# Patient Record
Sex: Male | Born: 1955 | Race: White | Hispanic: No | Smoking: Former smoker
Health system: Southern US, Community
[De-identification: ages and names within clinical notes are randomized; demographics above are authoritative.]

## PROBLEM LIST (undated history)

## (undated) DIAGNOSIS — F17200 Nicotine dependence, unspecified, uncomplicated: Secondary | ICD-10-CM

## (undated) DIAGNOSIS — I10 Essential (primary) hypertension: Secondary | ICD-10-CM

## (undated) DIAGNOSIS — Z87442 Personal history of urinary calculi: Secondary | ICD-10-CM

## (undated) DIAGNOSIS — E785 Hyperlipidemia, unspecified: Secondary | ICD-10-CM

## (undated) DIAGNOSIS — R911 Solitary pulmonary nodule: Secondary | ICD-10-CM

## (undated) DIAGNOSIS — C3480 Malignant neoplasm of overlapping sites of unspecified bronchus and lung: Secondary | ICD-10-CM

## (undated) DIAGNOSIS — C3490 Malignant neoplasm of unspecified part of unspecified bronchus or lung: Secondary | ICD-10-CM

## (undated) HISTORY — PX: LYMPH NODE BIOPSY: SHX201

## (undated) HISTORY — DX: Malignant neoplasm of unspecified part of unspecified bronchus or lung: C34.90

## (undated) HISTORY — PX: HAND SURGERY: SHX662

## (undated) HISTORY — PX: PORTACATH PLACEMENT: SHX2246

---

## 2015-09-21 ENCOUNTER — Emergency Department (HOSPITAL_BASED_OUTPATIENT_CLINIC_OR_DEPARTMENT_OTHER): Payer: Worker's Compensation

## 2015-09-21 ENCOUNTER — Emergency Department (HOSPITAL_BASED_OUTPATIENT_CLINIC_OR_DEPARTMENT_OTHER)
Admission: EM | Admit: 2015-09-21 | Discharge: 2015-09-21 | Disposition: A | Discharge status: Home / Self Care | Payer: Worker's Compensation | Attending: Emergency Medicine | Admitting: Emergency Medicine

## 2015-09-21 ENCOUNTER — Encounter (HOSPITAL_BASED_OUTPATIENT_CLINIC_OR_DEPARTMENT_OTHER): Payer: Self-pay

## 2015-09-21 DIAGNOSIS — S6992XA Unspecified injury of left wrist, hand and finger(s), initial encounter: Secondary | ICD-10-CM | POA: Diagnosis present

## 2015-09-21 DIAGNOSIS — Y998 Other external cause status: Secondary | ICD-10-CM | POA: Insufficient documentation

## 2015-09-21 DIAGNOSIS — Y9389 Activity, other specified: Secondary | ICD-10-CM | POA: Insufficient documentation

## 2015-09-21 DIAGNOSIS — Y9289 Other specified places as the place of occurrence of the external cause: Secondary | ICD-10-CM | POA: Diagnosis not present

## 2015-09-21 DIAGNOSIS — Y288XXA Contact with other sharp object, undetermined intent, initial encounter: Secondary | ICD-10-CM | POA: Diagnosis not present

## 2015-09-21 DIAGNOSIS — Z720 Tobacco use: Secondary | ICD-10-CM | POA: Insufficient documentation

## 2015-09-21 DIAGNOSIS — S66222A Laceration of extensor muscle, fascia and tendon of left thumb at wrist and hand level, initial encounter: Secondary | ICD-10-CM | POA: Diagnosis not present

## 2015-09-21 MED ORDER — LIDOCAINE HCL 2 % IJ SOLN
10.0000 mL | Freq: Once | INTRAMUSCULAR | Status: AC
  Administered 2015-09-21: 200 mg
  Filled 2015-09-21: qty 20

## 2015-09-21 MED ORDER — HYDROCODONE-ACETAMINOPHEN 5-325 MG PO TABS: ORAL_TABLET | ORAL | Status: DC

## 2015-09-21 MED ORDER — HYDROCODONE-ACETAMINOPHEN 5-325 MG PO TABS
2.0000 | ORAL_TABLET | Freq: Once | ORAL | Status: AC
  Administered 2015-09-21: 2 via ORAL
  Filled 2015-09-21: qty 2

## 2015-09-21 MED ORDER — ONDANSETRON HCL 8 MG PO TABS
4.0000 mg | ORAL_TABLET | Freq: Once | ORAL | Status: AC
  Administered 2015-09-21: 4 mg via ORAL

## 2015-09-21 MED ORDER — ONDANSETRON 4 MG PO TBDP
ORAL_TABLET | ORAL | Status: AC
  Filled 2015-09-21: qty 1

## 2015-09-21 MED ORDER — TETANUS-DIPHTH-ACELL PERTUSSIS 5-2.5-18.5 LF-MCG/0.5 IM SUSP
0.5000 mL | Freq: Once | INTRAMUSCULAR | Status: AC
  Administered 2015-09-21: 0.5 mL via INTRAMUSCULAR
  Filled 2015-09-21: qty 0.5

## 2015-09-21 NOTE — ED Provider Notes (Signed)
CSN: [id number]     Arrival date & time 09/21/15  1100 History   First MD Initiated Contact with Patient 09/21/15 1115     Chief Complaint  Patient presents with  . Hand Injury     (Consider location/radiation/quality/duration/timing/severity/associated sxs/prior Treatment) HPI Comments: Patient is a 59 year old male who presents to the emergency department with a complaint of laceration to the wrist.  The patient states that he was cutting limbs on. He accidentally cut his left wrist with a machete. The patient states he has severe pain and difficulty lifting his thumb. He denies being on any anticoagulation medications. He denies any history of any bleeding disorders. There've been no previous operations or procedures involving the left upper extremity. There were no other lacerations reported. The bleeding has been partially controlled with pressure. He has not taken any medication for this injury.  The history is provided by the patient.    History reviewed. No pertinent past medical history. History reviewed. No pertinent past surgical history. No family history on file. Social History  Substance Use Topics  . Smoking status: Current Every Day Smoker  . Smokeless tobacco: None  . Alcohol Use: Yes     Comment: daily    Review of Systems  Constitutional: Negative for activity change.       All ROS Neg except as noted in HPI  HENT: Negative for nosebleeds.   Eyes: Negative for photophobia and discharge.  Respiratory: Negative for cough, shortness of breath and wheezing.   Cardiovascular: Negative for chest pain and palpitations.  Gastrointestinal: Negative for abdominal pain and blood in stool.  Genitourinary: Negative for dysuria, frequency and hematuria.  Musculoskeletal: Negative for back pain, arthralgias and neck pain.  Skin:       Laceration wrist  Neurological: Negative for dizziness, seizures and speech difficulty.  Psychiatric/Behavioral: Negative for  hallucinations and confusion.      Allergies  Review of patient's allergies indicates no known allergies.  Home Medications   Prior to Admission medications   Medication Sig Start Date End Date Taking? Authorizing Provider  aspirin 81 MG tablet Take 81 mg by mouth daily.   Yes Historical Provider, MD   BP 168/97 mmHg  Pulse 78  Temp(Src) 98.2 F (36.8 C) (Oral)  Resp 20  Ht 5\' 8"  (1.727 m)  Wt 160 lb (72.576 kg)  BMI 24.33 kg/m2  SpO2 98% Physical Exam  Constitutional: He is oriented to person, place, and time. He appears well-developed and well-nourished.  Non-toxic appearance.  HENT:  Head: Normocephalic.  Right Ear: Tympanic membrane and external ear normal.  Left Ear: Tympanic membrane and external ear normal.  Eyes: EOM and lids are normal. Pupils are equal, round, and reactive to light.  Neck: Normal range of motion. Neck supple. Carotid bruit is not present.  Cardiovascular: Normal rate, regular rhythm, normal heart sounds, intact distal pulses and normal pulses.   Pulmonary/Chest: Breath sounds normal. No respiratory distress.  Abdominal: Soft. Bowel sounds are normal. There is no tenderness. There is no guarding.  Musculoskeletal: Normal range of motion.       Left hand: He exhibits tenderness and laceration. Normal sensation noted. Decreased strength noted. He exhibits thumb/finger opposition.       Hands: 6.4 cm laceration of the left wrist at the carpal area behind the left thumb. Limited flexion. Extreme difficulty with extension of the left thumb. Cap refill less than 2 sec. Radial pulse 2+. FROM of all other fingers and wrist. No palpable deformity  of the forearm area. FROM of the left elbow and shoulder.  Lymphadenopathy:       Head (right side): No submandibular adenopathy present.       Head (left side): No submandibular adenopathy present.    He has no cervical adenopathy.  Neurological: He is alert and oriented to person, place, and time. He has normal  strength. No cranial nerve deficit or sensory deficit. Coordination normal.  Good sensory to touch and 2 point discrimination of the left thumb.  Skin: Skin is warm and dry.  Psychiatric: He has a normal mood and affect. His speech is normal.  Nursing note and vitals reviewed.   ED Course  tetanus status updated. Case discussed with Dr. Mina Marble.   Marland Kitchen.Laceration Repair Date/Time: 09/21/2015 1:38 PM Performed by: Ivery Quale Authorized by: Ivery Quale Consent: Verbal consent obtained. Risks and benefits: risks, benefits and alternatives were discussed Consent given by: patient Patient understanding: patient states understanding of the procedure being performed Patient identity confirmed: arm band Time out: Immediately prior to procedure a "time out" was called to verify the correct patient, procedure, equipment, support staff and site/side marked as required. Body area: upper extremity Location details: left wrist Laceration length: 6.4 cm Foreign bodies: no foreign bodies Tendon involvement: extensor tendon involvement. Vascular damage: no Local anesthetic: lidocaine 2% without epinephrine Anesthetic total: 3 ml Patient sedated: no Preparation: Patient was prepped and draped in the usual sterile fashion. Irrigation solution: saline Amount of cleaning: extensive Debridement: none Skin closure: 4-0 nylon Technique: simple Approximation: close Approximation difficulty: simple Dressing: splint Patient tolerance: Patient tolerated the procedure well with no immediate complications   (including critical care time) Labs Review Labs Reviewed - No data to display  Imaging Review X-ray of the left wrist reviewed. No bone involvement, no foreign body noted. No results found. I have personally reviewed and evaluated these images and lab results as part of my medical decision-making.   EKG Interpretation None      MDM Vital signs reviewed. The blood pressure is elevated  at 168/97. Otherwise the vital signs are within normal limits. Pulse oximetry is 98% on room air. Within normal limits by my interpretation. After irrigation, examination of the fall reveals extensor tendon injury. This was confirmed with the examination. The wound was repaired with interrupted sutures of 4-0 nylon. The case has been discussed with Dr. Mina Marble (hand specialist). The option of having repair of his injury today was given to the patient. The patient opted to have the procedure on Monday. The patient will call the office on Monday at 8:30. I have given him instructions to remain nothing by mouth after midnight on Sunday. Prescription for Norco given to the patient for discomfort. The patient has been fitted with a thumb spica splint and bandage. Questions answered. Feel that it is safe for the patient to be discharged home with precautions discussed.    Final diagnoses:  None    **I have reviewed nursing notes, vital signs, and all appropriate lab and imaging results for this patient.Ivery Quale, PA-C 09/21/15 1344  Vanetta Mulders, MD 09/21/15 1500

## 2015-09-21 NOTE — ED Notes (Signed)
Pt cut left hand on machete at work this am-sent from urgent care with bulky dsg/coban

## 2015-09-21 NOTE — Discharge Instructions (Signed)
Please do not eat or drink after midnight on Sunday. Please call the hand surgeon's office at 8:30 Monday morning. At that time they will schedule your surgery, probably for Monday. Please use your thumb splint until seen by the hand specialist. Please keep your hand elevated above your heart. May use Norco for pain. This medication may cause drowsiness, please do not drive, drink, operate machinery, or pertussis. And activities requiring concentration when taking this medication. Please keep your wound clean and dry. Tendon Injury Tendons are strong, cordlike structures that connect muscle to bone. Tendons are made up of woven fibers, like a rope. A tendon injury is a tear (rupture) of the tendon. The rupture may be partial (only a few of the fibers in your tendon rupture) or complete (your entire tendon ruptures). CAUSES  Tendon injuries can be caused by high-stress activities, such as sports. They also can be caused by a repetitive injury or by a single injury from an excessive, rapid force. SYMPTOMS  Symptoms of tendon injury include pain when you move the joint close to the tendon. Other symptoms are swelling, redness, and warmth. DIAGNOSIS  Tendon injuries often can be diagnosed by physical exam. However, sometimes an X-ray exam or advanced imaging, such as magnetic resonance imaging (MRI), is necessary to determine the extent of the injury. TREATMENT  Partial tendon ruptures often can be treated with immobilization. A splint, bandage, or removable brace usually is used to immobilize the injured tendon. Most injured tendons need to be immobilized for 1-2 months before they are completely healed. Complete tendon ruptures may require surgical reattachment.   This information is not intended to replace advice given to you by your health care provider. Make sure you discuss any questions you have with your health care provider.   Document Released: 01/01/2005 Document Revised: 11/13/2011 Document  Reviewed: 02/15/2012 Elsevier Interactive Patient Education [employer]2016 [employer]

## 2016-10-27 IMAGING — CR DG WRIST COMPLETE 3+V*L*
4 series · 4 of 4 positions shown · non-contrast
Comparison: None.

CLINICAL DATA: Laceration left wrist with.  Initial encounter.

EXAM:
LEFT WRIST - COMPLETE 3+ VIEW

[x wrist pa left]
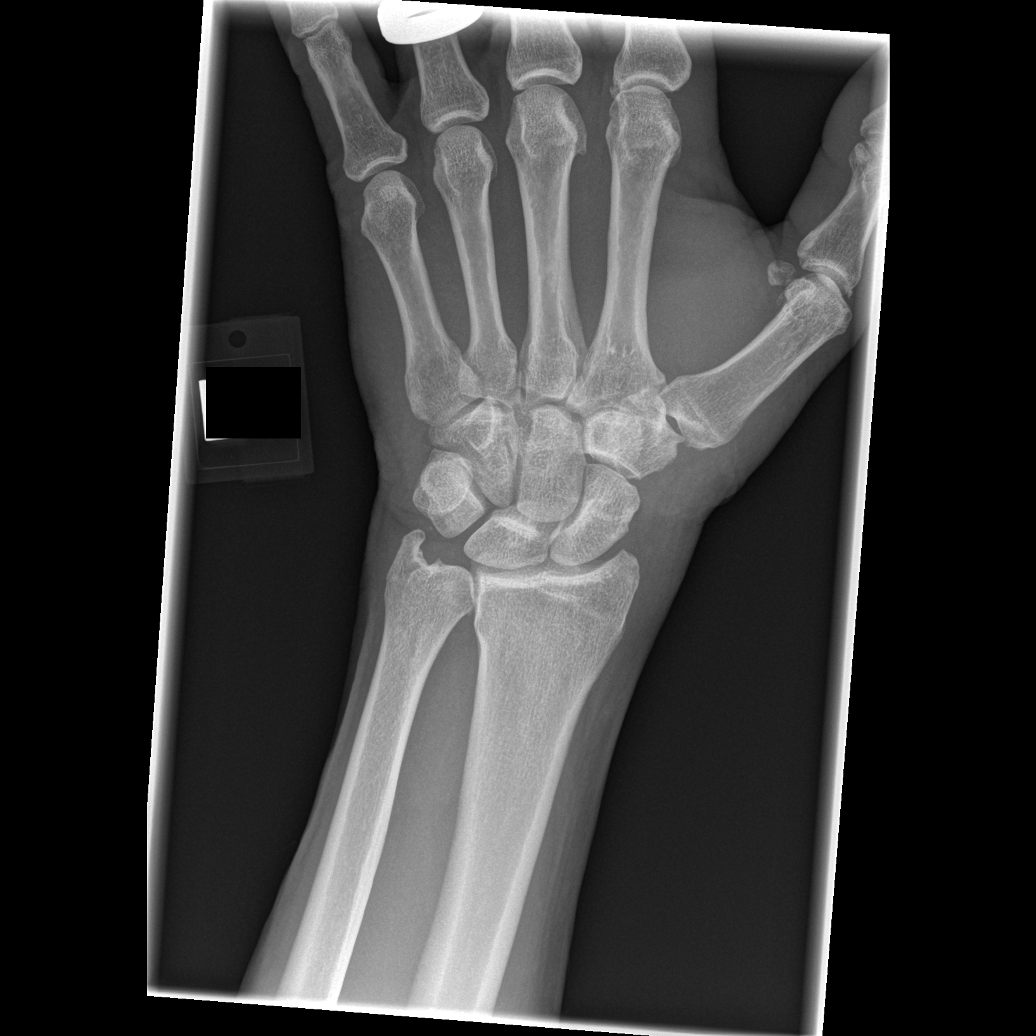

[x wrist obl left]
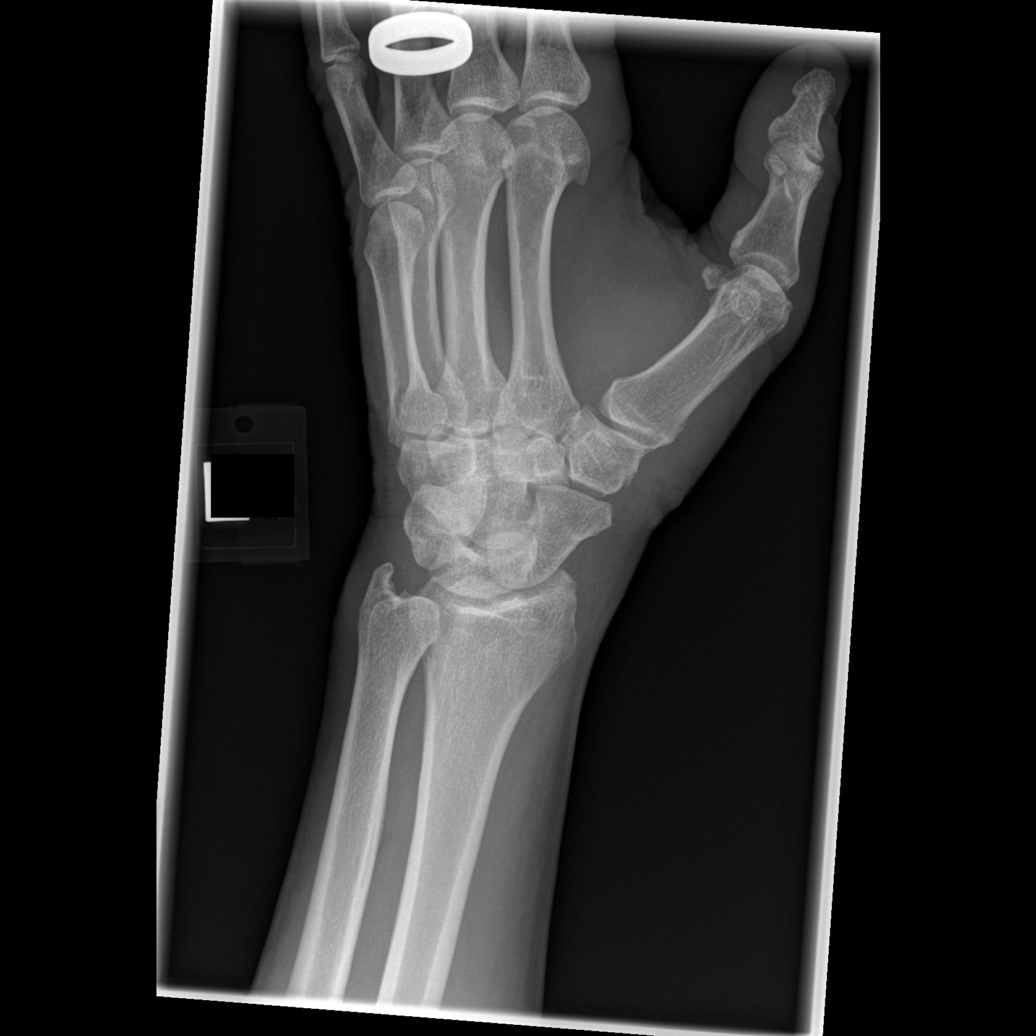

[x wrist lat left]
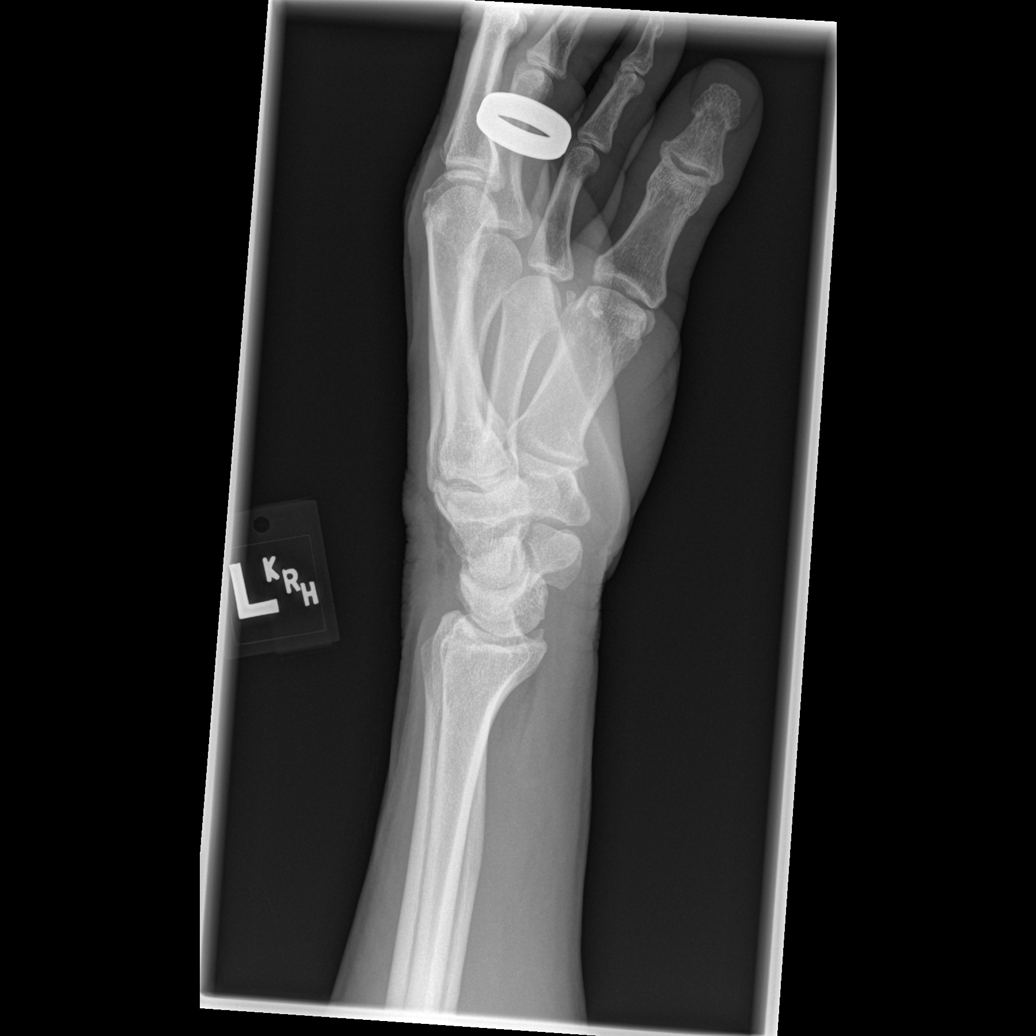

[x navicular]
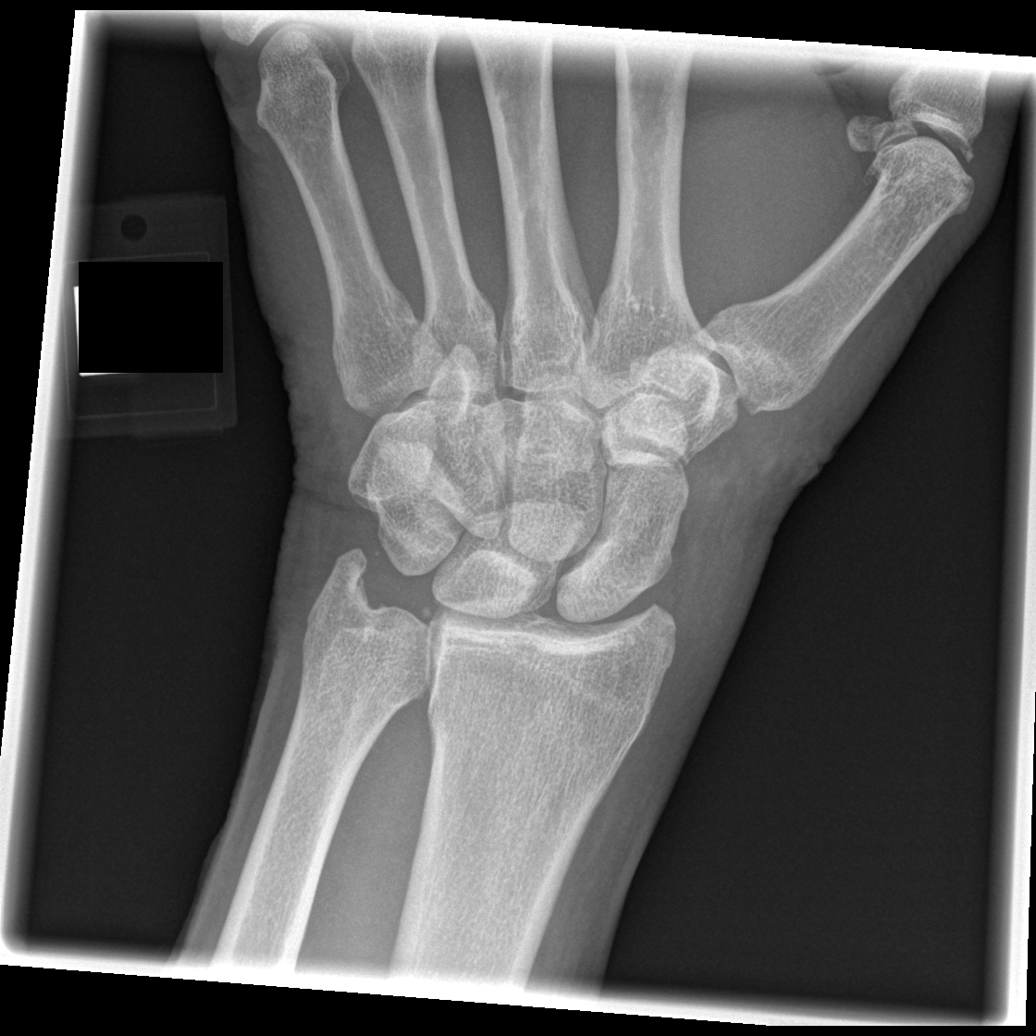

[4 of 4 positions shown; findings below may reference images not displayed]

FINDINGS: No evidence of acute fracture or soft tissue foreign body. Some soft
tissue swelling and injury is noted along the radial aspect of the
wrist and near the base of the thumb. Mild degenerative changes are
present in the wrist itself.
IMPRESSION: No fracture or foreign body.

## 2018-03-09 DIAGNOSIS — I10 Essential (primary) hypertension: Secondary | ICD-10-CM | POA: Diagnosis not present

## 2018-03-09 DIAGNOSIS — R0600 Dyspnea, unspecified: Secondary | ICD-10-CM | POA: Diagnosis not present

## 2018-03-09 DIAGNOSIS — Z720 Tobacco use: Secondary | ICD-10-CM | POA: Diagnosis not present

## 2018-04-08 DIAGNOSIS — Z720 Tobacco use: Secondary | ICD-10-CM | POA: Diagnosis not present

## 2018-04-08 DIAGNOSIS — I10 Essential (primary) hypertension: Secondary | ICD-10-CM | POA: Diagnosis not present

## 2018-04-08 DIAGNOSIS — Z6822 Body mass index (BMI) 22.0-22.9, adult: Secondary | ICD-10-CM | POA: Diagnosis not present

## 2018-07-12 DIAGNOSIS — I10 Essential (primary) hypertension: Secondary | ICD-10-CM | POA: Diagnosis not present

## 2018-07-12 DIAGNOSIS — E785 Hyperlipidemia, unspecified: Secondary | ICD-10-CM | POA: Diagnosis not present

## 2018-10-07 DIAGNOSIS — I10 Essential (primary) hypertension: Secondary | ICD-10-CM | POA: Diagnosis not present

## 2018-10-07 DIAGNOSIS — Z6823 Body mass index (BMI) 23.0-23.9, adult: Secondary | ICD-10-CM | POA: Diagnosis not present

## 2018-10-07 DIAGNOSIS — Z720 Tobacco use: Secondary | ICD-10-CM | POA: Diagnosis not present

## 2018-11-08 DIAGNOSIS — Z720 Tobacco use: Secondary | ICD-10-CM | POA: Diagnosis not present

## 2018-11-08 DIAGNOSIS — I10 Essential (primary) hypertension: Secondary | ICD-10-CM | POA: Diagnosis not present

## 2019-02-28 DIAGNOSIS — E785 Hyperlipidemia, unspecified: Secondary | ICD-10-CM | POA: Diagnosis not present

## 2019-02-28 DIAGNOSIS — Z720 Tobacco use: Secondary | ICD-10-CM | POA: Diagnosis not present

## 2019-02-28 DIAGNOSIS — I10 Essential (primary) hypertension: Secondary | ICD-10-CM | POA: Diagnosis not present

## 2023-11-12 DIAGNOSIS — E785 Hyperlipidemia, unspecified: Secondary | ICD-10-CM | POA: Diagnosis not present

## 2023-11-12 DIAGNOSIS — Z125 Encounter for screening for malignant neoplasm of prostate: Secondary | ICD-10-CM | POA: Diagnosis not present

## 2023-11-12 DIAGNOSIS — Z5320 Procedure and treatment not carried out because of patient's decision for unspecified reasons: Secondary | ICD-10-CM | POA: Diagnosis not present

## 2023-11-12 DIAGNOSIS — F1010 Alcohol abuse, uncomplicated: Secondary | ICD-10-CM | POA: Diagnosis not present

## 2023-11-12 DIAGNOSIS — Z720 Tobacco use: Secondary | ICD-10-CM | POA: Diagnosis not present

## 2023-11-12 DIAGNOSIS — Z6823 Body mass index (BMI) 23.0-23.9, adult: Secondary | ICD-10-CM | POA: Diagnosis not present

## 2023-11-12 DIAGNOSIS — I10 Essential (primary) hypertension: Secondary | ICD-10-CM | POA: Diagnosis not present

## 2023-11-12 DIAGNOSIS — Z79899 Other long term (current) drug therapy: Secondary | ICD-10-CM | POA: Diagnosis not present

## 2024-08-18 DIAGNOSIS — F17210 Nicotine dependence, cigarettes, uncomplicated: Secondary | ICD-10-CM | POA: Diagnosis not present

## 2024-08-18 DIAGNOSIS — J45909 Unspecified asthma, uncomplicated: Secondary | ICD-10-CM | POA: Diagnosis not present

## 2024-08-18 DIAGNOSIS — R0609 Other forms of dyspnea: Secondary | ICD-10-CM | POA: Diagnosis not present

## 2024-08-18 DIAGNOSIS — Z79899 Other long term (current) drug therapy: Secondary | ICD-10-CM | POA: Diagnosis not present

## 2024-08-18 DIAGNOSIS — J309 Allergic rhinitis, unspecified: Secondary | ICD-10-CM | POA: Diagnosis not present

## 2024-08-18 DIAGNOSIS — Z0000 Encounter for general adult medical examination without abnormal findings: Secondary | ICD-10-CM | POA: Diagnosis not present

## 2024-08-18 DIAGNOSIS — Z125 Encounter for screening for malignant neoplasm of prostate: Secondary | ICD-10-CM | POA: Diagnosis not present

## 2024-08-18 DIAGNOSIS — Z131 Encounter for screening for diabetes mellitus: Secondary | ICD-10-CM | POA: Diagnosis not present

## 2024-08-18 DIAGNOSIS — E785 Hyperlipidemia, unspecified: Secondary | ICD-10-CM | POA: Diagnosis not present

## 2024-08-18 DIAGNOSIS — I10 Essential (primary) hypertension: Secondary | ICD-10-CM | POA: Diagnosis not present

## 2024-08-24 DIAGNOSIS — Z122 Encounter for screening for malignant neoplasm of respiratory organs: Secondary | ICD-10-CM | POA: Diagnosis not present

## 2024-08-24 DIAGNOSIS — Z87891 Personal history of nicotine dependence: Secondary | ICD-10-CM | POA: Diagnosis not present

## 2024-08-25 DIAGNOSIS — Z0489 Encounter for examination and observation for other specified reasons: Secondary | ICD-10-CM | POA: Diagnosis not present

## 2024-08-25 DIAGNOSIS — Z87891 Personal history of nicotine dependence: Secondary | ICD-10-CM | POA: Diagnosis not present

## 2024-08-25 DIAGNOSIS — R911 Solitary pulmonary nodule: Secondary | ICD-10-CM | POA: Diagnosis not present

## 2024-08-29 ENCOUNTER — Ambulatory Visit: Admitting: Acute Care

## 2024-08-29 ENCOUNTER — Encounter: Payer: Self-pay | Admitting: Acute Care

## 2024-08-29 VITALS — BP 134/78 | HR 76 | Temp 97.8°F | Ht 68.0 in | Wt 165.4 lb

## 2024-08-29 DIAGNOSIS — R911 Solitary pulmonary nodule: Secondary | ICD-10-CM

## 2024-08-29 DIAGNOSIS — F17200 Nicotine dependence, unspecified, uncomplicated: Secondary | ICD-10-CM

## 2024-08-29 DIAGNOSIS — F17210 Nicotine dependence, cigarettes, uncomplicated: Secondary | ICD-10-CM | POA: Diagnosis not present

## 2024-08-29 DIAGNOSIS — R9389 Abnormal findings on diagnostic imaging of other specified body structures: Secondary | ICD-10-CM | POA: Diagnosis not present

## 2024-08-29 NOTE — Progress Notes (Signed)
 History of Present Illness Daniel Long is a 68 y.o. male every day smoker with a 53 pack year smoking history, referred by his PCP for an abnormal lung cancer screening scan. He will be followed by Dr. Shelah.   08/29/2024 Discussed the use of AI scribe software for clinical note transcription with the patient, who gave verbal consent to proceed.  History of Present Illness Daniel Long is a 68 year old male who presents for lung cancer screening follow-up. This is his first scan, and it was read as a LR 4 B.  He has been undergoing lung cancer screening and recently had his first scan this year, which was read as a Lung-RADS 4B. The scan revealed a right upper lobe pulmonary nodule measuring 16 by 14 millimeters. He has no previous chest CTs for comparison.  He has a significant smoking history, having started smoking at the age of 66 and continuing for approximately 53 years, currently smoking about a pack per day.  No recent weight loss or hemoptysis. He coughs up a little phlegm in the mornings but denies wheezing. He experiences shortness of breath with exertion, such as when going up hills, but not when bending over or standing back up.He has never had pulmonary function testing, so no formal diagnosis of COPD.  There is no family history of lung cancer or any other cancer that he can recall. He has no significant work exposures and does not have a basement in his home, which could expose him to radon.     Test Results: Low Dose Ct chest 09/20/2024 IMPRESSION: Lungs: There is an irregular solid noncalcified nodule in the inferior posterior right upper lobe measures 15 by 14 mm. Mild emphysematous changes Pleura: Negative for pleural effusion or pneumothorax. Airways: Imaged bronchi and trachea otherwisenegative. Mediastinum: Negative for mediastinal mass. Irregular concerning nodule in the right upper lobe.   Lung-RADS Category: 4B VERY SUSPICIOUS. RECOMMEND DIAGNOSTIC CHEST CT;  PET/CT MAY BE CONSIDERED IF >=8MM SOLID NODULE OR SOLID COMPONENT; TISSUE SAMPLING; AND/OR REFERRAL FOR CLINICAL EVALUATION. IF AIRWAY NODULE THEN REFER FOR CLINICAL EVALUATION       No data to display              No data to display          BNP No results found for: BNP  ProBNP No results found for: PROBNP  PFT No results found for: FEV1PRE, FEV1POST, FVCPRE, FVCPOST, TLC, DLCOUNC, PREFEV1FVCRT, PSTFEV1FVCRT  No results found.   Past medical hx History reviewed. No pertinent past medical history.   Social History   Tobacco Use   Smoking status: Every Day    Types: Cigarettes   Tobacco comments:    1 pack a day         Began smoking at 68 years old  Substance Use Topics   Alcohol use: Yes    Comment: daily   Drug use: No    [name] reports that he has been smoking cigarettes. He does not have any smokeless tobacco history on file. He reports current alcohol use. He reports that he does not use drugs.  Tobacco Cessation: Ready to quit: Not Answered Counseling given: Not Answered Tobacco comments: 1 pack a day   Began smoking at 68 years old Current every day smoker , I spent 4 minutes counseling patient on  steps to stop use of tobacco products. I have provided patient with information on receiving free nicotine replacement therapy, and contact numbers for hypnosis for  smoking cessation as well as acupuncture for smoking cessation.   Past surgical hx, Family hx, Social hx all reviewed.  Current Outpatient Medications on File Prior to Visit  Medication Sig   amLODipine-valsartan (EXFORGE) 10-320 MG tablet Take 1 tablet by mouth daily.   aspirin 81 MG tablet Take 81 mg by mouth daily.   atorvastatin (LIPITOR) 20 MG tablet Take 20 mg by mouth daily.   fluticasone (FLONASE) 50 MCG/ACT nasal spray Place 2 sprays into both nostrils daily.   levocetirizine (XYZAL) 5 MG tablet Take 5 mg by mouth daily as needed.   No current  facility-administered medications on file prior to visit.     No Known Allergies  Review Of Systems:  Constitutional:   No  weight loss, night sweats,  Fevers, chills, fatigue, or  lassitude.  HEENT:   No headaches,  Difficulty swallowing,  Tooth/dental problems, or  Sore throat,                No sneezing, itching, ear ache, nasal congestion, post nasal drip,   CV:  No chest pain,  Orthopnea, PND, swelling in lower extremities, anasarca, dizziness, palpitations, syncope.   GI  No heartburn, indigestion, abdominal pain, nausea, vomiting, diarrhea, change in bowel habits, loss of appetite, bloody stools.   Resp: + shortness of breath with exertion or at rest.  + excess mucus only in the morning, no productive cough,  No non-productive cough,  No coughing up of blood.  No change in color of mucus.  No wheezing.  No chest wall deformity  Skin: no rash or lesions.  GU: no dysuria, change in color of urine, no urgency or frequency.  No flank pain, no hematuria   MS:  No joint pain or swelling.  No decreased range of motion.  No back pain.  Psych:  No change in mood or affect. No depression or anxiety.  No memory loss.   Vital Signs BP 134/78   Pulse 76   Temp 97.8 F (36.6 C) (Oral)   Ht 5' 8 (1.727 m)   Wt 165 lb 6.4 oz (75 kg)   SpO2 96%   BMI 25.15 kg/m    Physical Exam:  General- No distress,  A&Ox3, pleasant ENT: No sinus tenderness, TM clear, pale nasal mucosa, no oral exudate,no post nasal drip, no LAN Cardiac: S1, S2, regular rate and rhythm, no murmur Chest: No wheeze/ rales/ dullness; no accessory muscle use, no nasal flaring, no sternal retractions Abd.: Soft Non-tender, ND, BS +, Body mass index is 25.15 kg/m.  Ext: No clubbing cyanosis, edema, no obvious deformities Neuro:  normal strength, MAE x 4, A&O x 3 appropriate Skin: No rashes, warm and dry, no obvious skin lesions  Psych: normal mood and behavior   Assessment & Plan Right upper lobe pulmonary  nodule in current every day smoker  16x14 mm right upper lobe nodule, Lung-RADS 4B, irregular, concerning for malignancy.  No prior CTs for comparison.  Differential includes malignancy vs benign. - Order PET scan at [facility], [place]. - Schedule follow-up 1-2 weeks post-PET scan to discuss results and determine next best steps in plan of care - Instruct to report new symptoms like hemoptysis immediately.  Tobacco use disorder Chronic tobacco use, 1 pack/day for 53 years. Has nicotine patches. Discussed cessation strategies and emphasized quitting to reduce lung cancer risk. - Encourage continued nicotine patch use, consider gum or mints. - Advise reducing cigarette use to one every three hours. - Recommend sugar-free  cinnamon candies for oral fixation. - Provide smoking cessation hotline information.    I spent 20 minutes dedicated to the care of this patient on the date of this encounter to include pre-visit review of records, face-to-face time with the patient discussing conditions above, post visit ordering of testing, clinical documentation with the electronic health record, making appropriate referrals as documented, and communicating necessary information to the patient's healthcare team.    [name] JULIANNA Lites, NP 08/29/2024  10:56 AM

## 2024-08-29 NOTE — Patient Instructions (Signed)
 It is good to see today.   We have we have reviewed your low-dose CT scan which shows a 1.4 x 1.6 mm right upper lobe nodule. With your smoking history this is concerning. I have ordered a PET scan as follow-up diagnostic. You will get a call to get this scheduled. This will be done at [facility] here in [place]. He will follow-up with me 1 to 2 weeks after the scan has been completed so that we can review the results. We will determine next best steps at that time. Please work on quitting smoking. You can receive free nicotine replacement therapy (patches, gum, or mints) by calling 1-800-QUIT NOW. Please call so we can get you on the path to becoming a non-smoker. I know it is hard, but you can do this!  Hypnosis for smoking cessation  [employer] [phone]  Acupuncture for smoking cessation  [facility] [phone]    Call for any blood in your sputum so that we can get you in to be seen sooner. Please contact office for sooner follow up if symptoms do not improve or worsen or seek emergency care

## 2024-09-09 ENCOUNTER — Encounter (HOSPITAL_COMMUNITY)
Admission: RE | Admit: 2024-09-09 | Discharge: 2024-09-09 | Disposition: A | Discharge status: Home / Self Care | Source: Ambulatory Visit | Attending: Acute Care | Admitting: Acute Care

## 2024-09-09 DIAGNOSIS — R911 Solitary pulmonary nodule: Secondary | ICD-10-CM | POA: Insufficient documentation

## 2024-09-09 LAB — GLUCOSE, CAPILLARY: Glucose-Capillary: 95 mg/dL (ref 70–99)

## 2024-09-09 MED ORDER — FLUDEOXYGLUCOSE F - 18 (FDG) INJECTION
8.2500 | Freq: Once | INTRAVENOUS | Status: AC
  Administered 2024-09-09: 8.21 via INTRAVENOUS

## 2024-09-20 ENCOUNTER — Ambulatory Visit: Admitting: Acute Care

## 2024-09-20 ENCOUNTER — Inpatient Hospital Stay (INDEPENDENT_AMBULATORY_CARE_PROVIDER_SITE_OTHER)
Admission: RE | Admit: 2024-09-20 | Discharge: 2024-09-20 | Disposition: A | Discharge status: Home / Self Care | Source: Ambulatory Visit | Attending: Nurse Practitioner | Admitting: Nurse Practitioner

## 2024-09-20 ENCOUNTER — Other Ambulatory Visit (HOSPITAL_BASED_OUTPATIENT_CLINIC_OR_DEPARTMENT_OTHER): Admitting: Radiology

## 2024-09-20 ENCOUNTER — Encounter: Payer: Self-pay | Admitting: Acute Care

## 2024-09-20 ENCOUNTER — Other Ambulatory Visit: Payer: Self-pay | Admitting: Acute Care

## 2024-09-20 ENCOUNTER — Telehealth: Payer: Self-pay | Admitting: Acute Care

## 2024-09-20 VITALS — BP 128/74 | HR 75 | Temp 97.8°F | Ht 68.0 in | Wt 171.8 lb

## 2024-09-20 DIAGNOSIS — R942 Abnormal results of pulmonary function studies: Secondary | ICD-10-CM | POA: Diagnosis not present

## 2024-09-20 DIAGNOSIS — R9389 Abnormal findings on diagnostic imaging of other specified body structures: Secondary | ICD-10-CM

## 2024-09-20 DIAGNOSIS — J449 Chronic obstructive pulmonary disease, unspecified: Secondary | ICD-10-CM | POA: Diagnosis not present

## 2024-09-20 DIAGNOSIS — F17210 Nicotine dependence, cigarettes, uncomplicated: Secondary | ICD-10-CM

## 2024-09-20 DIAGNOSIS — R918 Other nonspecific abnormal finding of lung field: Secondary | ICD-10-CM | POA: Diagnosis not present

## 2024-09-20 DIAGNOSIS — F17200 Nicotine dependence, unspecified, uncomplicated: Secondary | ICD-10-CM

## 2024-09-20 DIAGNOSIS — J439 Emphysema, unspecified: Secondary | ICD-10-CM | POA: Diagnosis not present

## 2024-09-20 DIAGNOSIS — R911 Solitary pulmonary nodule: Secondary | ICD-10-CM

## 2024-09-20 NOTE — H&P (View-Only) (Signed)
 History of Present Illness Severus Brodzinski is a 68 y.o. male every day smoker with a 53 pack year smoking history, referred by his PCP for an abnormal lung cancer screening scan. He will be followed by Dr. Shelah.    09/20/2024 Discussed the use of AI scribe software for clinical note transcription with the patient, who gave verbal consent to proceed.  Synopsis [name] is a 68 year old male who presents for lung cancer screening follow-up. This is his first scan, and it was read as a LR 4 B.   He has been undergoing lung cancer screening and recently had his first scan this year, which was read as a Lung-RADS 4B. The scan revealed a right upper lobe pulmonary nodule measuring 16 by 14 millimeters. He has no previous chest CTs for comparison.   He has a significant smoking history, having started smoking at the age of 67 and continuing for approximately 53 years, currently smoking about a pack per day.   No recent weight loss or hemoptysis. He coughs up a little phlegm in the mornings but denies wheezing. He experiences shortness of breath with exertion, such as when going up hills, but not when bending over or standing back up.He has never had pulmonary function testing, so no formal diagnosis of COPD.   There is no family history of lung cancer or any other cancer that he can recall. He has no significant work exposures and does not have a basement in his home, which could expose him to radon.  Plan was for a PET scan to further evaluate the abnormal lung cancer screening scan. He is here today to review the PET scan results.   History of Present Illness [name] is a 68 year old male who presents with a right upper lobe lung nodule suspicious for primary bronchogenic carcinoma.  A low-dose CT scan on September 20, 2024, revealed a 15 by 14 mm nodule in the right upper lobe. A subsequent PET scan approximately three weeks later showed an 11 mm hypermetabolic nodule in the same location with  an SUV of 8.7. Additionally, a hypermetabolic right parahilar lymph node was noted with an SUV of 8.7 also. Per radiology , this is suspicious for primary bronchogenic carcinoma with nodal involvement, and we discussed next best steps. Plan is for navigational bronchoscopy with biopsies and EBUS on 10/30. I have ordered a Super D CT Chest to be done the morning of the procedure to help plan the navigation. Pt. Received a letter today with all the details of the procedure, where and when to be where.He had no further questions as the OV was concluded.   He denies experiencing weight loss or hemoptysis. He is currently on blood pressure medication at a dose of 80 mg and takes aspirin. His medical history includes high blood pressure and diabetes, but he has no known heart issues or latex allergy. He also denies having sleep apnea.     Test Results:  PET Scan 09/09/2024 Hypermetabolic 11 mm right upper lobe pulmonary nodule (SUV max 8.7), suspicious for primary bronchogenic carcinoma 2. Hypermetabolic right perihilar lymph node (SUV max 8.7), concerning for nodal involvement. 3. No mediastinal nodal metastasis identified. 4. No additional pulmonary nodules identified. 5. No evidence of malignancy in the abdomen or pelvis. 6. No skeletal metastasis.    Low Dose Ct chest 09/20/2024>> [facility] IMPRESSION: Lungs: There is an irregular solid noncalcified nodule in the inferior posterior right upper lobe measures 15 by 14  mm. Mild emphysematous changes Pleura: Negative for pleural effusion or pneumothorax. Airways: Imaged bronchi and trachea otherwisenegative. Mediastinum: Negative for mediastinal mass. Irregular concerning nodule in the right upper lobe.   Lung-RADS Category: 4B VERY SUSPICIOUS. RECOMMEND DIAGNOSTIC CHEST CT; PET/CT MAY BE CONSIDERED IF >=8MM SOLID NODULE OR SOLID COMPONENT; TISSUE SAMPLING; AND/OR REFERRAL FOR CLINICAL EVALUATION. IF AIRWAY NODULE THEN REFER FOR CLINICAL  EVALUATION        No data to display              No data to display          BNP No results found for: BNP  ProBNP No results found for: PROBNP  PFT No results found for: FEV1PRE, FEV1POST, FVCPRE, FVCPOST, TLC, DLCOUNC, PREFEV1FVCRT, PSTFEV1FVCRT  NM PET Image Initial (PI) Skull Base To Thigh Result Date: 09/12/2024 EXAM: PET AND CT SKULL BASE TO MID THIGH 09/09/2024 12:31:55 PM TECHNIQUE: RADIOPHARMACEUTICAL: 8.21 mCi F-18 FDG Uptake time 60 minutes. Glucose level 95 mg/dl. PET imaging was acquired from the base of the skull to the mid thighs. Non-contrast enhanced computed tomography was obtained for attenuation correction and anatomic localization. COMPARISON: CT 08/25/2024 CLINICAL HISTORY: Lung nodule, > 8mm. 8.21 mCi F18 FDG IV RAC @ 1110 MM; BG - 95; pt and dose verified w/ FMU; PET Scan for lung nodule seen on imaging study; lung nodule, > 8mm; Not diabetic; no surgeries; no biopsies; no therapies or treatments; no OBMD; EOV FINDINGS: HEAD AND NECK: No metabolically active cervical lymphadenopathy. CHEST: Rounded nodule in the right upper lobe measures 11 mm and has metabolic activity with SUVmax equal 8.7. There is a hypermetabolic right perihilar lymph node on image 70 with SUVmax 8.7. No additional pulmonary nodules. No mediastinal nodal metastasis identified. No central. ABDOMEN AND PELVIS: No evidence of malignancy in the abdomen and pelvis. Physiologic activity within the gastrointestinal and genitourinary systems. No additional CT findings of atherosclerotic change of the aorta. BONES AND SOFT TISSUE: No skeletal metastasis. IMPRESSION: 1. Hypermetabolic 11 mm right upper lobe pulmonary nodule (SUV max 8.7), suspicious for primary bronchogenic carcinoma 2. Hypermetabolic right perihilar lymph node (SUV max 8.7), concerning for nodal involvement. 3. No mediastinal nodal metastasis identified. 4. No additional pulmonary nodules identified. 5. No evidence  of malignancy in the abdomen or pelvis. 6. No skeletal metastasis. Electronically signed by: Norleen Boxer MD 09/12/2024 12:07 PM EDT RP Workstation: [id number]     Past medical hx History reviewed. No pertinent past medical history.   Social History   Tobacco Use   Smoking status: Every Day    Types: Cigarettes   Tobacco comments:    14-15 a day KRD 09/20/2024        Began smoking at 68 years old  Substance Use Topics   Alcohol use: Yes    Comment: daily   Drug use: No    Mr.Griffee reports that he has been smoking cigarettes. He does not have any smokeless tobacco history on file. He reports current alcohol use. He reports that he does not use drugs.  Tobacco Cessation: Ready to quit: Not Answered Counseling given: Not Answered Tobacco comments: 14-15 a day KRD 09/20/2024  Began smoking at 68 years old Current every day smoker, counseled not to smoke.  Past surgical hx, Family hx, Social hx all reviewed.  Current Outpatient Medications on File Prior to Visit  Medication Sig   amLODipine-valsartan (EXFORGE) 10-320 MG tablet Take 1 tablet by mouth daily.   aspirin 81 MG tablet Take 81 mg  by mouth daily.   atorvastatin (LIPITOR) 20 MG tablet Take 20 mg by mouth daily.   fluticasone (FLONASE) 50 MCG/ACT nasal spray Place 2 sprays into both nostrils daily.   levocetirizine (XYZAL) 5 MG tablet Take 5 mg by mouth daily as needed.   No current facility-administered medications on file prior to visit.     No Known Allergies  Review Of Systems:  Constitutional:   No  weight loss, night sweats,  Fevers, chills, fatigue, or  lassitude.  HEENT:   No headaches,  Difficulty swallowing,  Tooth/dental problems, or  Sore throat,                No sneezing, itching, ear ache, nasal congestion, post nasal drip,   CV:  No chest pain,  Orthopnea, PND, swelling in lower extremities, anasarca, dizziness, palpitations, syncope.   GI  No heartburn, indigestion, abdominal pain, nausea,  vomiting, diarrhea, change in bowel habits, loss of appetite, bloody stools.   Resp: + baseline  shortness of breath with exertion less at rest.  No excess mucus, no productive cough,  No non-productive cough,  No coughing up of blood.  No change in color of mucus.  No wheezing.  No chest wall deformity  Skin: no rash or lesions.  GU: no dysuria, change in color of urine, no urgency or frequency.  No flank pain, no hematuria   MS:  No joint pain or swelling.  No decreased range of motion.  No back pain.  Psych:  No change in mood or affect. No depression or anxiety.  No memory loss.   Vital Signs BP 128/74   Pulse 75   Temp 97.8 F (36.6 C) (Temporal)   Ht 5' 8 (1.727 m)   Wt 171 lb 12.8 oz (77.9 kg)   SpO2 95%   BMI 26.12 kg/m    Physical Exam:  General- No distress,  A&Ox3, pleasant ENT: No sinus tenderness, TM clear, pale nasal mucosa, no oral exudate,no post nasal drip, no LAN Cardiac: S1, S2, regular rate and rhythm, no murmur Chest: No wheeze/ rales/ dullness; no accessory muscle use, no nasal flaring, no sternal retractions, slightly diminished per bases Abd.: Soft Non-tender, ND, BS +, Body mass index is 26.12 kg/m.  Ext: No clubbing cyanosis, edema, no obvious deformities noted Neuro:  normal strength, MAE x 4, A&O x 3, appropriate Skin: No rashes, warm and dry, no obvious skin lesions  Psych: normal mood and behavior  Assessment & Plan Suspicious right upper lobe lung nodule and right parahilar lymphadenopathy PET scan indicates hypermetabolic right upper lobe nodule and right parahilar lymph node, suggestive of primary bronchogenic carcinoma with possible nodal involvement. - Schedule robotic-assisted navigational bronchoscopy with Dr. Norleen Chill on October 30th for diagnosis confirmation and disease assessment. - Hold aspirin on October 28th and 29th before procedure. - Will need  transportation and post-anesthesia care for 24 hours. - Follow up one week  post-procedure to review biopsy results.  AVS 09/20/2024 Your PET scan does show the nodule of concern is PET avid. We discussed the option of biopsy now , and you have agreed to proceed.  I have placed an order for a bronchoscopy with biopsies.  We have discussed the procedure in detail.  We have reviewed the risks and benefits of the procedure. These include bleeding, infection, puncture of the lung, and adverse reaction to anesthesia. You have agreed to proceed with biopsy to evaluate the right  upper lobe nodule. Your procedure will be done  by Dr. Norleen Chill You will receive a letter today with date time and information pertaining to the procedure. You will need someone to drive you to the procedure, stay with you during the procedure, and stay with you after the procedure. You will also need someone to stay with you for 24 hours after anesthesia to ensure you have cleared and are doing well. You will follow-up with me 1 week after the procedure to review the results and to ensure you are doing well. Call if you need us  prior to the procedure or if you have any questions at all. Please contact office for sooner follow up if symptoms do not improve or worsen or seek emergency care       I spent 25 minutes dedicated to the care of this patient on the date of this encounter to include pre-visit review of records, face-to-face time with the patient discussing conditions above, post visit ordering of testing, clinical documentation with the electronic health record, making appropriate referrals as documented, and communicating necessary information to the patient's healthcare team.      [name] JULIANNA Lites, NP 09/20/2024  11:21 AM

## 2024-09-20 NOTE — Telephone Encounter (Signed)
 Please schedule the following:  Provider performing procedure: [place]ald Diagnosis:  Right sided lung nodule Which side if for nodule / mass?  Right  Procedure:  Robotic assisted navigational bronchoscopy with biopsy  Has patient been spoken to by Provider and given informed consent?  Yes, [name] NP on 09/20/2024 Anesthesia:  Lajuana Do you need Fluro?  Yes Duration of procedure:  1.5 hours Date:  10/06/2024 Alternate Date: There is availability 10/30  Time: Fourth case of the day Location:  [facility] Does patient have OSA?  No DM? No Or Latex allergy?  No Medication Restriction/ Anticoagulate/Antiplatelet:  Hold ASA 2 days before procedure (Hold 10/28, 10/29 and day of procedure) Pre-op Labs Ordered:determined by Anesthesia Imaging request:   LDCT was done 08/29/2024 (If, SuperDimension CT Chest, please have STAT courier sent to [facility])

## 2024-09-20 NOTE — Progress Notes (Signed)
 History of Present Illness Daniel Long is a 68 y.o. male every day smoker with a 53 pack year smoking history, referred by his PCP for an abnormal lung cancer screening scan. He will be followed by Dr. Shelah.    09/20/2024 Discussed the use of AI scribe software for clinical note transcription with the patient, who gave verbal consent to proceed.  Synopsis Daniel Long is a 68 year old male who presents for lung cancer screening follow-up. This is his first scan, and it was read as a LR 4 B.   He has been undergoing lung cancer screening and recently had his first scan this year, which was read as a Lung-RADS 4B. The scan revealed a right upper lobe pulmonary nodule measuring 16 by 14 millimeters. He has no previous chest CTs for comparison.   He has a significant smoking history, having started smoking at the age of 67 and continuing for approximately 53 years, currently smoking about a pack per day.   No recent weight loss or hemoptysis. He coughs up a little phlegm in the mornings but denies wheezing. He experiences shortness of breath with exertion, such as when going up hills, but not when bending over or standing back up.He has never had pulmonary function testing, so no formal diagnosis of COPD.   There is no family history of lung cancer or any other cancer that he can recall. He has no significant work exposures and does not have a basement in his home, which could expose him to radon.  Plan was for a PET scan to further evaluate the abnormal lung cancer screening scan. He is here today to review the PET scan results.   History of Present Illness Daniel Long is a 68 year old male who presents with a right upper lobe lung nodule suspicious for primary bronchogenic carcinoma.  A low-dose CT scan on September 20, 2024, revealed a 15 by 14 mm nodule in the right upper lobe. A subsequent PET scan approximately three weeks later showed an 11 mm hypermetabolic nodule in the same location with  an SUV of 8.7. Additionally, a hypermetabolic right parahilar lymph node was noted with an SUV of 8.7 also. Per radiology , this is suspicious for primary bronchogenic carcinoma with nodal involvement, and we discussed next best steps. Plan is for navigational bronchoscopy with biopsies and EBUS on 10/30. I have ordered a Super D CT Chest to be done the morning of the procedure to help plan the navigation. Pt. Received a letter today with all the details of the procedure, where and when to be where.He had no further questions as the OV was concluded.   He denies experiencing weight loss or hemoptysis. He is currently on blood pressure medication at a dose of 80 mg and takes aspirin. His medical history includes high blood pressure and diabetes, but he has no known heart issues or latex allergy. He also denies having sleep apnea.     Test Results:  PET Scan 09/09/2024 Hypermetabolic 11 mm right upper lobe pulmonary nodule (SUV max 8.7), suspicious for primary bronchogenic carcinoma 2. Hypermetabolic right perihilar lymph node (SUV max 8.7), concerning for nodal involvement. 3. No mediastinal nodal metastasis identified. 4. No additional pulmonary nodules identified. 5. No evidence of malignancy in the abdomen or pelvis. 6. No skeletal metastasis.    Low Dose Ct chest 09/20/2024>> [facility] IMPRESSION: Lungs: There is an irregular solid noncalcified nodule in the inferior posterior right upper lobe measures 15 by 14  mm. Mild emphysematous changes Pleura: Negative for pleural effusion or pneumothorax. Airways: Imaged bronchi and trachea otherwisenegative. Mediastinum: Negative for mediastinal mass. Irregular concerning nodule in the right upper lobe.   Lung-RADS Category: 4B VERY SUSPICIOUS. RECOMMEND DIAGNOSTIC CHEST CT; PET/CT MAY BE CONSIDERED IF >=8MM SOLID NODULE OR SOLID COMPONENT; TISSUE SAMPLING; AND/OR REFERRAL FOR CLINICAL EVALUATION. IF AIRWAY NODULE THEN REFER FOR CLINICAL  EVALUATION        No data to display              No data to display          BNP No results found for: BNP  ProBNP No results found for: PROBNP  PFT No results found for: FEV1PRE, FEV1POST, FVCPRE, FVCPOST, TLC, DLCOUNC, PREFEV1FVCRT, PSTFEV1FVCRT  NM PET Image Initial (PI) Skull Base To Thigh Result Date: 09/12/2024 EXAM: PET AND CT SKULL BASE TO MID THIGH 09/09/2024 12:31:55 PM TECHNIQUE: RADIOPHARMACEUTICAL: 8.21 mCi F-18 FDG Uptake time 60 minutes. Glucose level 95 mg/dl. PET imaging was acquired from the base of the skull to the mid thighs. Non-contrast enhanced computed tomography was obtained for attenuation correction and anatomic localization. COMPARISON: CT 08/25/2024 CLINICAL HISTORY: Lung nodule, > 8mm. 8.21 mCi F18 FDG IV RAC @ 1110 MM; BG - 95; pt and dose verified w/ FMU; PET Scan for lung nodule seen on imaging study; lung nodule, > 8mm; Not diabetic; no surgeries; no biopsies; no therapies or treatments; no OBMD; EOV FINDINGS: HEAD AND NECK: No metabolically active cervical lymphadenopathy. CHEST: Rounded nodule in the right upper lobe measures 11 mm and has metabolic activity with SUVmax equal 8.7. There is a hypermetabolic right perihilar lymph node on image 70 with SUVmax 8.7. No additional pulmonary nodules. No mediastinal nodal metastasis identified. No central. ABDOMEN AND PELVIS: No evidence of malignancy in the abdomen and pelvis. Physiologic activity within the gastrointestinal and genitourinary systems. No additional CT findings of atherosclerotic change of the aorta. BONES AND SOFT TISSUE: No skeletal metastasis. IMPRESSION: 1. Hypermetabolic 11 mm right upper lobe pulmonary nodule (SUV max 8.7), suspicious for primary bronchogenic carcinoma 2. Hypermetabolic right perihilar lymph node (SUV max 8.7), concerning for nodal involvement. 3. No mediastinal nodal metastasis identified. 4. No additional pulmonary nodules identified. 5. No evidence  of malignancy in the abdomen or pelvis. 6. No skeletal metastasis. Electronically signed by: Norleen Boxer MD 09/12/2024 12:07 PM EDT RP Workstation: [id number]     Past medical hx History reviewed. No pertinent past medical history.   Social History   Tobacco Use   Smoking status: Every Day    Types: Cigarettes   Tobacco comments:    14-15 a day KRD 09/20/2024        Began smoking at 68 years old  Substance Use Topics   Alcohol use: Yes    Comment: daily   Drug use: No    Mr.Griffee reports that he has been smoking cigarettes. He does not have any smokeless tobacco history on file. He reports current alcohol use. He reports that he does not use drugs.  Tobacco Cessation: Ready to quit: Not Answered Counseling given: Not Answered Tobacco comments: 14-15 a day KRD 09/20/2024  Began smoking at 68 years old Current every day smoker, counseled not to smoke.  Past surgical hx, Family hx, Social hx all reviewed.  Current Outpatient Medications on File Prior to Visit  Medication Sig   amLODipine-valsartan (EXFORGE) 10-320 MG tablet Take 1 tablet by mouth daily.   aspirin 81 MG tablet Take 81 mg  by mouth daily.   atorvastatin (LIPITOR) 20 MG tablet Take 20 mg by mouth daily.   fluticasone (FLONASE) 50 MCG/ACT nasal spray Place 2 sprays into both nostrils daily.   levocetirizine (XYZAL) 5 MG tablet Take 5 mg by mouth daily as needed.   No current facility-administered medications on file prior to visit.     No Known Allergies  Review Of Systems:  Constitutional:   No  weight loss, night sweats,  Fevers, chills, fatigue, or  lassitude.  HEENT:   No headaches,  Difficulty swallowing,  Tooth/dental problems, or  Sore throat,                No sneezing, itching, ear ache, nasal congestion, post nasal drip,   CV:  No chest pain,  Orthopnea, PND, swelling in lower extremities, anasarca, dizziness, palpitations, syncope.   GI  No heartburn, indigestion, abdominal pain, nausea,  vomiting, diarrhea, change in bowel habits, loss of appetite, bloody stools.   Resp: + baseline  shortness of breath with exertion less at rest.  No excess mucus, no productive cough,  No non-productive cough,  No coughing up of blood.  No change in color of mucus.  No wheezing.  No chest wall deformity  Skin: no rash or lesions.  GU: no dysuria, change in color of urine, no urgency or frequency.  No flank pain, no hematuria   MS:  No joint pain or swelling.  No decreased range of motion.  No back pain.  Psych:  No change in mood or affect. No depression or anxiety.  No memory loss.   Vital Signs BP 128/74   Pulse 75   Temp 97.8 F (36.6 C) (Temporal)   Ht 5' 8 (1.727 m)   Wt 171 lb 12.8 oz (77.9 kg)   SpO2 95%   BMI 26.12 kg/m    Physical Exam:  General- No distress,  A&Ox3, pleasant ENT: No sinus tenderness, TM clear, pale nasal mucosa, no oral exudate,no post nasal drip, no LAN Cardiac: S1, S2, regular rate and rhythm, no murmur Chest: No wheeze/ rales/ dullness; no accessory muscle use, no nasal flaring, no sternal retractions, slightly diminished per bases Abd.: Soft Non-tender, ND, BS +, Body mass index is 26.12 kg/m.  Ext: No clubbing cyanosis, edema, no obvious deformities noted Neuro:  normal strength, MAE x 4, A&O x 3, appropriate Skin: No rashes, warm and dry, no obvious skin lesions  Psych: normal mood and behavior  Assessment & Plan Suspicious right upper lobe lung nodule and right parahilar lymphadenopathy PET scan indicates hypermetabolic right upper lobe nodule and right parahilar lymph node, suggestive of primary bronchogenic carcinoma with possible nodal involvement. - Schedule robotic-assisted navigational bronchoscopy with Dr. Norleen Chill on October 30th for diagnosis confirmation and disease assessment. - Hold aspirin on October 28th and 29th before procedure. - Will need  transportation and post-anesthesia care for 24 hours. - Follow up one week  post-procedure to review biopsy results.  AVS 09/20/2024 Your PET scan does show the nodule of concern is PET avid. We discussed the option of biopsy now , and you have agreed to proceed.  I have placed an order for a bronchoscopy with biopsies.  We have discussed the procedure in detail.  We have reviewed the risks and benefits of the procedure. These include bleeding, infection, puncture of the lung, and adverse reaction to anesthesia. You have agreed to proceed with biopsy to evaluate the right  upper lobe nodule. Your procedure will be done  by Dr. Norleen Chill You will receive a letter today with date time and information pertaining to the procedure. You will need someone to drive you to the procedure, stay with you during the procedure, and stay with you after the procedure. You will also need someone to stay with you for 24 hours after anesthesia to ensure you have cleared and are doing well. You will follow-up with me 1 week after the procedure to review the results and to ensure you are doing well. Call if you need us  prior to the procedure or if you have any questions at all. Please contact office for sooner follow up if symptoms do not improve or worsen or seek emergency care       I spent 25 minutes dedicated to the care of this patient on the date of this encounter to include pre-visit review of records, face-to-face time with the patient discussing conditions above, post visit ordering of testing, clinical documentation with the electronic health record, making appropriate referrals as documented, and communicating necessary information to the patient's healthcare team.      [name] JULIANNA Lites, NP 09/20/2024  11:21 AM

## 2024-09-20 NOTE — Patient Instructions (Signed)
 It is good to see you today. Your PET scan does show the nodule of concern is PET avid. We discussed the option of biopsy now , and you have agreed to proceed.  I have placed an order for a bronchoscopy with biopsies.  We have discussed the procedure in detail.  We have reviewed the risks and benefits of the procedure. These include bleeding, infection, puncture of the lung, and adverse reaction to anesthesia. You have agreed to proceed with biopsy to evaluate the right  upper lobe nodule. Your procedure will be done by Dr. Norleen Chill You will receive a letter today with date time and information pertaining to the procedure. You will need someone to drive you to the procedure, stay with you during the procedure, and stay with you after the procedure. You will also need someone to stay with you for 24 hours after anesthesia to ensure you have cleared and are doing well. You will follow-up with me 1 week after the procedure to review the results and to ensure you are doing well. Call if you need us  prior to the procedure or if you have any questions at all. Please contact office for sooner follow up if symptoms do not improve or worsen or seek emergency care

## 2024-09-20 NOTE — Telephone Encounter (Signed)
 Scheduled with Kendall for 10/06/24 at 12:08 PM, check in by 9:30 AM.  Case# [id number].  Follow up scheduled with SG on 10/12/24 at 9:30 AM.  Letter prepared & given to pt by nurse while in the office.  Sending to [facility] for PA.

## 2024-10-04 ENCOUNTER — Encounter (HOSPITAL_COMMUNITY): Payer: Self-pay | Admitting: Pulmonary Disease

## 2024-10-05 ENCOUNTER — Other Ambulatory Visit: Payer: Self-pay

## 2024-10-05 ENCOUNTER — Encounter (HOSPITAL_COMMUNITY): Payer: Self-pay | Admitting: Pulmonary Disease

## 2024-10-05 NOTE — Anesthesia Preprocedure Evaluation (Signed)
 Anesthesia Evaluation  Patient identified by MRN, date of birth, ID band Patient awake    Reviewed: Allergy & Precautions, NPO status , Patient's Chart, lab work & pertinent test results  History of Anesthesia Complications Negative for: history of anesthetic complications  Airway Mallampati: II  TM Distance: >3 FB Neck ROM: Full    Dental no notable dental hx. (+) Edentulous Upper, Missing,    Pulmonary Current Smoker and Patient abstained from smoking. R sided lung nodule a 53 pack year smoking history   Pulmonary exam normal breath sounds clear to auscultation       Cardiovascular hypertension, Pt. on medications (-) angina (-) Past MI Normal cardiovascular exam Rhythm:Regular Rate:Normal     Neuro/Psych    GI/Hepatic Neg liver ROS,neg GERD  ,,  Endo/Other  negative endocrine ROSneg diabetes    Renal/GU      Musculoskeletal   Abdominal   Peds  Hematology   Anesthesia Other Findings   Reproductive/Obstetrics                              Anesthesia Physical Anesthesia Plan  ASA: 3  Anesthesia Plan: General   Post-op Pain Management: Tylenol  PO (pre-op)*   Induction: Intravenous  PONV Risk Score and Plan: Treatment may vary due to age or medical condition, Dexamethasone, Ondansetron , Midazolam, Propofol infusion and TIVA  Airway Management Planned: Oral ETT  Additional Equipment: None  Intra-op Plan:   Post-operative Plan: Extubation in OR  Informed Consent: I have reviewed the patients History and Physical, chart, labs and discussed the procedure including the risks, benefits and alternatives for the proposed anesthesia with the patient or authorized representative who has indicated his/her understanding and acceptance.     Dental advisory given  Plan Discussed with: CRNA and Surgeon  Anesthesia Plan Comments:          Anesthesia Quick Evaluation

## 2024-10-05 NOTE — Progress Notes (Signed)
 SDW call  Patient was given pre-op instructions over the phone. Patient verbalized understanding of instructions provided. Denies SOB, fever, cough or chest pain.     PCP - [name] Sic, NP Cardiologist - denies Pulmonary:    PPM/ICD - denies Device Orders - na Rep Notified - na   Chest x-ray - na EKG -  DOS, 10/06/2024 Stress Test - 9/2/20220, [facility] - 08/10/2019, [facility] -   Sleep Study/sleep apnea/CPAP: denies  Non-diabetic  Blood Thinner Instructions: denies Aspirin Instructions:states last dose 10/03/2024   ERAS Protcol - NPO   Anesthesia review: No  Your procedure is scheduled on Thursday October 06, 2024  Report to [facility] Main Entrance A at  0900  A.M., then check in with the Admitting office.  Call this number if you have problems the morning of surgery:  [phone]   If you have any questions prior to your surgery date call [phone]: Open Monday-Friday 8am-4pm If you experience any cold or flu symptoms such as cough, fever, chills, shortness of breath, etc. between now and your scheduled surgery, please notify us  at the above number    Remember:  Do not eat or drink after midnight the night before your surgery  Take these medicines the morning of surgery with A SIP OF WATER:  Atorvastatin, flonase  As needed: xyzal  As of today, STOP taking any Aspirin (unless otherwise instructed by your surgeon) Aleve, Naproxen, Ibuprofen, Motrin, Advil, Goody's, BC's, all herbal medications, fish oil, and all vitamins.

## 2024-10-06 ENCOUNTER — Encounter (HOSPITAL_COMMUNITY): Payer: Self-pay | Admitting: Pulmonary Disease

## 2024-10-06 ENCOUNTER — Encounter (HOSPITAL_COMMUNITY)
Admission: RE | Disposition: A | Discharge status: Home / Self Care | Payer: Self-pay | Source: Home / Self Care | Attending: Pulmonary Disease

## 2024-10-06 ENCOUNTER — Encounter (HOSPITAL_COMMUNITY): Payer: Self-pay | Admitting: Anesthesiology

## 2024-10-06 ENCOUNTER — Ambulatory Visit (HOSPITAL_COMMUNITY)

## 2024-10-06 ENCOUNTER — Ambulatory Visit (HOSPITAL_BASED_OUTPATIENT_CLINIC_OR_DEPARTMENT_OTHER): Payer: Self-pay | Admitting: Anesthesiology

## 2024-10-06 ENCOUNTER — Ambulatory Visit (HOSPITAL_COMMUNITY)
Admission: RE | Admit: 2024-10-06 | Discharge: 2024-10-06 | Disposition: A | Discharge status: Home / Self Care | Attending: Pulmonary Disease | Admitting: Pulmonary Disease

## 2024-10-06 DIAGNOSIS — C3411 Malignant neoplasm of upper lobe, right bronchus or lung: Secondary | ICD-10-CM | POA: Diagnosis not present

## 2024-10-06 DIAGNOSIS — C3432 Malignant neoplasm of lower lobe, left bronchus or lung: Secondary | ICD-10-CM | POA: Insufficient documentation

## 2024-10-06 DIAGNOSIS — R911 Solitary pulmonary nodule: Secondary | ICD-10-CM

## 2024-10-06 DIAGNOSIS — E119 Type 2 diabetes mellitus without complications: Secondary | ICD-10-CM | POA: Insufficient documentation

## 2024-10-06 DIAGNOSIS — F17210 Nicotine dependence, cigarettes, uncomplicated: Secondary | ICD-10-CM | POA: Diagnosis not present

## 2024-10-06 DIAGNOSIS — I10 Essential (primary) hypertension: Secondary | ICD-10-CM | POA: Diagnosis not present

## 2024-10-06 DIAGNOSIS — F17200 Nicotine dependence, unspecified, uncomplicated: Secondary | ICD-10-CM | POA: Insufficient documentation

## 2024-10-06 DIAGNOSIS — Z48813 Encounter for surgical aftercare following surgery on the respiratory system: Secondary | ICD-10-CM | POA: Diagnosis not present

## 2024-10-06 DIAGNOSIS — R846 Abnormal cytological findings in specimens from respiratory organs and thorax: Secondary | ICD-10-CM | POA: Diagnosis not present

## 2024-10-06 HISTORY — PX: VIDEO BRONCHOSCOPY WITH ENDOBRONCHIAL NAVIGATION: SHX6175

## 2024-10-06 HISTORY — PX: VIDEO BRONCHOSCOPY WITH ENDOBRONCHIAL ULTRASOUND: SHX6177

## 2024-10-06 HISTORY — DX: Essential (primary) hypertension: I10

## 2024-10-06 HISTORY — PX: BRONCHIAL NEEDLE ASPIRATION BIOPSY: SHX5106

## 2024-10-06 HISTORY — PX: BRONCHIAL BIOPSY: SHX5109

## 2024-10-06 LAB — CBC
HCT: 47 % (ref 39.0–52.0)
Hemoglobin: 16.3 g/dL (ref 13.0–17.0)
MCH: 31.8 pg (ref 26.0–34.0)
MCHC: 34.7 g/dL (ref 30.0–36.0)
MCV: 91.8 fL (ref 80.0–100.0)
Platelets: 252 K/uL (ref 150–400)
RBC: 5.12 MIL/uL (ref 4.22–5.81)
RDW: 12.7 % (ref 11.5–15.5)
WBC: 9.9 K/uL (ref 4.0–10.5)
nRBC: 0 % (ref 0.0–0.2)

## 2024-10-06 LAB — BASIC METABOLIC PANEL WITH GFR
Anion gap: 9 (ref 5–15)
BUN: 11 mg/dL (ref 8–23)
CO2: 23 mmol/L (ref 22–32)
Calcium: 8.7 mg/dL — ABNORMAL LOW (ref 8.9–10.3)
Chloride: 107 mmol/L (ref 98–111)
Creatinine, Ser: 0.62 mg/dL (ref 0.61–1.24)
GFR, Estimated: >60 mL/min (ref >60)
Glucose, Bld: 110 mg/dL — ABNORMAL HIGH (ref 70–99)
Potassium: 3.9 mmol/L (ref 3.5–5.1)
Sodium: 139 mmol/L (ref 135–145)

## 2024-10-06 SURGERY — VIDEO BRONCHOSCOPY WITH ENDOBRONCHIAL NAVIGATION
Anesthesia: General | Laterality: Right

## 2024-10-06 MED ORDER — ONDANSETRON HCL 4 MG/2ML IJ SOLN
INTRAMUSCULAR | Status: DC | PRN
  Administered 2024-10-06: 4 mg via INTRAVENOUS

## 2024-10-06 MED ORDER — PHENYLEPHRINE HCL-NACL 20-0.9 MG/250ML-% IV SOLN
INTRAVENOUS | Status: DC | PRN
Start: 2024-10-06 — End: 2024-10-06
  Administered 2024-10-06: 20 ug/min via INTRAVENOUS

## 2024-10-06 MED ORDER — FENTANYL CITRATE (PF) 100 MCG/2ML IJ SOLN: 25.0000 ug | INTRAMUSCULAR | Status: DC | PRN

## 2024-10-06 MED ORDER — MIDAZOLAM HCL (PF) 2 MG/2ML IJ SOLN
INTRAMUSCULAR | Status: DC | PRN
Start: 2024-10-06 — End: 2024-10-06
  Administered 2024-10-06: 2 mg via INTRAVENOUS

## 2024-10-06 MED ORDER — SODIUM CHLORIDE 0.9 % IV SOLN: INTRAVENOUS | Status: DC | PRN

## 2024-10-06 MED ORDER — LACTATED RINGERS IV SOLN: INTRAVENOUS | Status: DC

## 2024-10-06 MED ORDER — FENTANYL CITRATE (PF) 250 MCG/5ML IJ SOLN
INTRAMUSCULAR | Status: DC | PRN
  Administered 2024-10-06 (×4): 50 ug via INTRAVENOUS

## 2024-10-06 MED ORDER — DEXAMETHASONE SOD PHOSPHATE PF 10 MG/ML IJ SOLN
INTRAMUSCULAR | Status: DC | PRN
  Administered 2024-10-06: 10 mg via INTRAVENOUS

## 2024-10-06 MED ORDER — ONDANSETRON HCL 4 MG/2ML IJ SOLN: 4.0000 mg | Freq: Once | INTRAMUSCULAR | Status: DC | PRN

## 2024-10-06 MED ORDER — CHLORHEXIDINE GLUCONATE 0.12 % MT SOLN: 15.0000 mL | Freq: Once | OROMUCOSAL | Status: AC

## 2024-10-06 MED ORDER — SUGAMMADEX SODIUM 200 MG/2ML IV SOLN
INTRAVENOUS | Status: DC | PRN
  Administered 2024-10-06: 400 mg via INTRAVENOUS

## 2024-10-06 MED ORDER — EPHEDRINE SULFATE-NACL 50-0.9 MG/10ML-% IV SOSY
PREFILLED_SYRINGE | INTRAVENOUS | Status: DC | PRN
  Administered 2024-10-06 (×2): 5 mg via INTRAVENOUS

## 2024-10-06 MED ORDER — GLYCOPYRROLATE PF 0.2 MG/ML IJ SOSY
PREFILLED_SYRINGE | INTRAMUSCULAR | Status: DC | PRN
  Administered 2024-10-06 (×3): .1 mg via INTRAVENOUS

## 2024-10-06 MED ORDER — LIDOCAINE 2% (20 MG/ML) 5 ML SYRINGE
INTRAMUSCULAR | Status: DC | PRN
  Administered 2024-10-06: 100 mg via INTRAVENOUS

## 2024-10-06 MED ORDER — CHLORHEXIDINE GLUCONATE 0.12 % MT SOLN
OROMUCOSAL | Status: AC
  Administered 2024-10-06: 15 mL via OROMUCOSAL
  Filled 2024-10-06: qty 15

## 2024-10-06 MED ORDER — PROPOFOL 500 MG/50ML IV EMUL
INTRAVENOUS | Status: DC | PRN
  Administered 2024-10-06: 125 ug/kg/min via INTRAVENOUS

## 2024-10-06 MED ORDER — PROPOFOL 10 MG/ML IV BOLUS
INTRAVENOUS | Status: DC | PRN
  Administered 2024-10-06: 20 mg via INTRAVENOUS
  Administered 2024-10-06: 150 mg via INTRAVENOUS
  Administered 2024-10-06: 20 mg via INTRAVENOUS
  Administered 2024-10-06: 50 mg via INTRAVENOUS

## 2024-10-06 MED ORDER — ACETAMINOPHEN 10 MG/ML IV SOLN: 1000.0000 mg | Freq: Once | INTRAVENOUS | Status: DC | PRN

## 2024-10-06 MED ORDER — ROCURONIUM BROMIDE 10 MG/ML (PF) SYRINGE
PREFILLED_SYRINGE | INTRAVENOUS | Status: DC | PRN
  Administered 2024-10-06: 10 mg via INTRAVENOUS
  Administered 2024-10-06: 65 mg via INTRAVENOUS
  Administered 2024-10-06 (×2): 10 mg via INTRAVENOUS
  Administered 2024-10-06: 35 mg via INTRAVENOUS
  Administered 2024-10-06: 20 mg via INTRAVENOUS

## 2024-10-06 NOTE — Transfer of Care (Signed)
 Immediate Anesthesia Transfer of Care Note  Patient: Daniel Long  Procedure(s) Performed: VIDEO BRONCHOSCOPY WITH ENDOBRONCHIAL NAVIGATION (Right) BRONCHOSCOPY, WITH EBUS BRONCHOSCOPY, WITH NEEDLE ASPIRATION BIOPSY BRONCHOSCOPY, WITH BIOPSY  Patient Location: PACU  Anesthesia Type:General  Level of Consciousness: awake, alert , oriented, and drowsy  Airway & Oxygen Therapy: Patient Spontanous Breathing and Patient connected to face mask oxygen  Post-op Assessment: Report given to RN, Post -op Vital signs reviewed and stable, and Patient moving all extremities X 4  Post vital signs: Reviewed and stable  Last Vitals:  Vitals Value Taken Time  BP    Temp    Pulse 84 10/06/24 14:20  Resp 19 10/06/24 14:20  SpO2 92 % 10/06/24 14:20  Vitals shown include unfiled device data.  Last Pain:  Vitals:   10/06/24 0906  TempSrc: Oral  PainSc: 0-No pain         Complications: No notable events documented.

## 2024-10-06 NOTE — Procedures (Signed)
 Procedure Note  Patient: Daniel Long  [employer] Cios mobile C-arm was utilized to identify and biopsy right upper lobe nodule.   Needle-in-lesion was confirmed using real-time Cios imaging, and images were uploaded to PACS.     Dorn Chill, MD [facility] Office: [phone]   See Amion for personal pager PCCM on call pager [phone] until 7pm. Please call Elink 7p-7a. [phone]

## 2024-10-06 NOTE — Anesthesia Procedure Notes (Addendum)
 Procedure Name: Intubation Date/Time: 10/06/2024 11:58 AM  Performed by: Mollie Olivia SAUNDERS, CRNAPre-anesthesia Checklist: Patient identified, Emergency Drugs available, Suction available and Patient being monitored Patient Re-evaluated:Patient Re-evaluated prior to induction Oxygen Delivery Method: Circle system utilized Preoxygenation: Pre-oxygenation with 100% oxygen Induction Type: IV induction Ventilation: Oral airway inserted - appropriate to patient size, Mask ventilation with difficulty and Two handed mask ventilation required Laryngoscope Size: Glidescope and 4 Grade View: Grade I Tube type: Oral Tube size: 8.5 mm Number of attempts: 1 Airway Equipment and Method: Oral airway, Rigid stylet and Video-laryngoscopy Placement Confirmation: ETT inserted through vocal cords under direct vision, positive ETCO2 and breath sounds checked- equal and bilateral Secured at: 23 cm Tube secured with: Tape Dental Injury: Teeth and Oropharynx as per pre-operative assessment

## 2024-10-06 NOTE — Interval H&P Note (Signed)
 History and Physical Interval Note:  10/06/2024 11:24 AM  Daniel Long  has presented today for surgery, with the diagnosis of lung nodule.  The various methods of treatment have been discussed with the patient and family. After consideration of risks, benefits and other options for treatment, the patient has consented to  Procedure(s): VIDEO BRONCHOSCOPY WITH ENDOBRONCHIAL NAVIGATION (Right) BRONCHOSCOPY, WITH EBUS (N/A) as a surgical intervention.  The patient's history has been reviewed, patient examined, no change in status, stable for surgery.  I have reviewed the patient's chart and labs.  Questions were answered to the patient's satisfaction.     Dorn KATHEE Chill

## 2024-10-06 NOTE — Op Note (Signed)
 Video Bronchoscopy with Robotic Assisted Bronchoscopic Navigation   Date of Operation: 10/06/2024   Pre-op Diagnosis: Lung Nodules  Post-op Diagnosis: Lung Nodules  Surgeon: Dorn Chill, MD  Assistants: Carter Helling, MD  Anesthesia: General endotracheal anesthesia  Operation: Flexible video fiberoptic bronchoscopy with robotic assistance and biopsies.  Estimated Blood Loss: Minimal  Complications: None  Indications and History: Daniel Long is a 68 y.o. male with history of lung nodules.  Recommendation made to achieve a tissue diagnosis via robotic assisted navigational bronchoscopy.  The risks, benefits, complications, treatment options and expected outcomes were discussed with the patient.  The possibilities of pneumothorax, pneumonia, reaction to medication, pulmonary aspiration, perforation of a viscus, bleeding, failure to diagnose a condition and creating a complication requiring transfusion or operation were discussed with the patient who freely signed the consent.    Description of Procedure: The patient was seen in the Preoperative Area, was examined and was deemed appropriate to proceed.  The patient was taken to [facility] room 3, identified as Daniel Long and the procedure verified as Flexible Video Fiberoptic Bronchoscopy.  A Time Out was held and the above information confirmed.   Prior to the date of the procedure a high-resolution CT scan of the chest was performed. Utilizing ION software program a virtual tracheobronchial tree was generated to allow the creation of distinct navigation pathways to the patient's parenchymal abnormalities. After being taken to the operating room general anesthesia was initiated and the patient  was orally intubated. The video fiberoptic bronchoscope was introduced via the endotracheal tube and a general inspection was performed which showed normal right and left lung anatomy. Aspiration of the bilateral mainstems was completed to remove  any remaining secretions. Robotic catheter inserted into patient's endotracheal tube.   Target #1 Right Upper Lobe Nodule: The distinct navigation pathways prepared prior to this procedure were then utilized to navigate to patient's lesion identified on CT scan. The robotic catheter was secured into place and the vision probe was withdrawn.  Lesion location was approximated using fluoroscopy.  Local registration and targeting was performed using [employer] Cios mobile C-arm three-dimensional imaging. Under fluoroscopic guidance transbronchial needle biopsies, and transbronchial forceps biopsies were performed to be sent for cytology and pathology.  Needle-in-lesion was confirmed using Cios mobile C-arm.  Target #2 Left Lower Lobe Ground Glass Nodule: The distinct navigation pathways prepared prior to this procedure were then utilized to navigate to patient's lesion identified on CT scan. The robotic catheter was secured into place and the vision probe was withdrawn.  Lesion location was approximated using fluoroscopy.  Local registration and targeting was performed using [employer] Cios mobile C-arm three-dimensional imaging. Under fluoroscopic guidance transbronchial needle brushings, transbronchial needle biopsies, and transbronchial forceps biopsies were performed to be sent for cytology and pathology.    EBUS The EBUS bronchoscope was then introduce and lymph node stations 11R, 7 and 4L biopsies via transbronchial needle biopsies.  At the end of the procedure a general airway inspection was performed and there was no evidence of active bleeding. The bronchoscope was removed.  The patient tolerated the procedure well. There was no significant blood loss and there were no obvious complications. A post-procedural chest x-ray is pending.  Samples Target #1 Right Upper Lobe Nodule: 1. Transbronchial needle biopsies from RUL nodule 2. Transbronchial forceps biopsies from RUL  Nodule   Samples Target #2 Left Lower lobe Ground Glass Nodule: 1. Transbronchial needle brushings from LLL nodule 2. Transbronchial needle biopsies from LLL nodule 3.  Transbronchial forceps biopsies from LLL nodule  Ebus Samples 11R - slides and cytology 7 - slides and cytology 4L - slides and cytology  Plans:  The patient will be discharged from the PACU to home when recovered from anesthesia and after chest x-ray is reviewed. We will review the cytology, pathology and microbiology results with the patient when they become available. Outpatient followup will be with [name], NP.

## 2024-10-07 LAB — CYTOLOGY - NON PAP

## 2024-10-10 LAB — CYTOLOGY - NON PAP

## 2024-10-10 NOTE — Progress Notes (Signed)
 History of Present Illness Daniel Long is a 68 y.o. male with ***   10/10/2024 Discussed the use of AI scribe software for clinical note transcription with the patient, who gave verbal consent to proceed.  History of Present Illness Hi Daniel Long,   Patient has follow up with you on 11/5. RUL and LLL showing adenocarcinoma. The LN stations biopsied were negative. There were some right hilar nodes with uptake on PET scan. 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      Test Results:     Latest Ref Rng & Units 10/06/2024    9:20 AM  CBC  WBC 4.0 - 10.5 K/uL 9.9   Hemoglobin 13.0 - 17.0 g/dL 83.6   Hematocrit 60.9 - 52.0 % 47.0   Platelets 150 - 400 K/uL 252        Latest Ref Rng & Units 10/06/2024    9:20 AM  BMP  Glucose 70 - 99 mg/dL 889   BUN 8 - 23 mg/dL 11   Creatinine 9.38 - 1.24 mg/dL 9.37   Sodium 864 - 854 mmol/L 139   Potassium 3.5 - 5.1 mmol/L 3.9   Chloride 98 - 111 mmol/L 107   CO2 22 - 32 mmol/L 23   Calcium 8.9 - 10.3 mg/dL 8.7     BNP No results found for: BNP  ProBNP No results found for: PROBNP  PFT No results found for: FEV1PRE, FEV1POST, FVCPRE, FVCPOST, TLC, DLCOUNC, PREFEV1FVCRT, PSTFEV1FVCRT  DG Chest Port 1 View Result Date: 10/06/2024 CLINICAL DATA:  Status post bronchoscopy and biopsy. EXAM: PORTABLE CHEST 1 VIEW COMPARISON:  Chest CT dated 09/20/2024. FINDINGS: No pneumothorax post biopsy. Right upper lobe nodule better seen on the chest CT. No consolidative changes. No pleural effusion. The cardiac silhouette is within normal limits. No acute osseous pathology. IMPRESSION: 1. No pneumothorax post biopsy. 2. Right upper lobe nodule. Electronically Signed   By: Daniel Long M.D.   On: 10/06/2024 14:41   DG C-ARM BRONCHOSCOPY Result Date: 10/06/2024 C-ARM BRONCHOSCOPY: Fluoroscopy was utilized by the requesting physician.  No radiographic interpretation.   DG C-Arm  1-60 Min-No Report Result Date: 10/06/2024 Fluoroscopy was utilized by the requesting physician.  No radiographic interpretation.   DG C-Arm 1-60 Min-No Report Result Date: 10/06/2024 Fluoroscopy was utilized by the requesting physician.  No radiographic interpretation.   CT Super D Chest Wo Contrast Result Date: 09/24/2024 CLINICAL DATA:  Abnormal chest x-ray, multiple pulmonary nodules. EXAM: CT CHEST WITHOUT CONTRAST TECHNIQUE: Multidetector CT imaging of the chest was performed using thin slice collimation for electromagnetic bronchoscopy planning purposes, without intravenous contrast. RADIATION DOSE REDUCTION: This exam was performed according to the departmental dose-optimization program which includes automated exposure control, adjustment of the mA and/or kV according to patient size and/or use of iterative reconstruction technique. COMPARISON:  08/25/2024, 09/09/2024. FINDINGS: Cardiovascular: The heart is normal in size and there is no pericardial effusion. Multi-vessel coronary artery calcifications are noted. There is atherosclerotic calcification aorta without evidence of aneurysm. The pulmonary trunk is normal in caliber. Mediastinum/Nodes: No enlarged mediastinal, hilar, or axillary lymph nodes. Thyroid gland, trachea, and esophagus demonstrate no significant findings. Lungs/Pleura: Centrilobular emphysematous changes are present in the lungs. No consolidation, effusion, or pneumothorax is seen. There is atelectasis at the lung bases. There is a stable lobular right upper lobe pulmonary nodule measuring 1.3 cm, axial image 30. A few scattered 3 mm pulmonary nodules are present bilaterally. Upper Abdomen: No  acute abnormality. Musculoskeletal: No acute osseous abnormality. Degenerative changes are present in the thoracic spine. IMPRESSION: 1. Stable lobular pulmonary nodule in the right upper lobe, recently evaluated by PET-CT and suspicious for primary bronchogenic carcinoma. 2. Scattered 3  mm pulmonary nodules bilaterally. 3. Emphysema. 4. Coronary artery calcifications. 5. Aortic atherosclerosis. Electronically Signed   By: Daniel Long M.D.   On: 09/24/2024 19:19     Past medical hx Past Medical History:  Diagnosis Date   Hypertension      Social History   Tobacco Use   Smoking status: Every Day    Types: Cigarettes   Tobacco comments:    14-15 a day KRD 09/20/2024        Began smoking at 68 years old  Vaping Use   Vaping status: Never Used  Substance Use Topics   Alcohol use: Not Currently    Comment: daily   Drug use: Yes    Types: Marijuana    [name] reports that he has been smoking cigarettes. He does not have any smokeless tobacco history on file. He reports that he does not currently use alcohol. He reports current drug use. Drug: Marijuana.  Tobacco Cessation: Ready to quit: Not Answered Counseling given: Not Answered Tobacco comments: 14-15 a day KRD 09/20/2024  Began smoking at 68 years old   Past surgical hx, Family hx, Social hx all reviewed.  Current Outpatient Medications on File Prior to Visit  Medication Sig   amLODipine-valsartan (EXFORGE) 10-320 MG tablet Take 1 tablet by mouth daily.   aspirin 81 MG tablet Take 81 mg by mouth daily.   atorvastatin (LIPITOR) 20 MG tablet Take 20 mg by mouth daily.   fluticasone (FLONASE) 50 MCG/ACT nasal spray Place 1-2 sprays into both nostrils daily as needed for allergies.   levocetirizine (XYZAL) 5 MG tablet Take 5 mg by mouth daily as needed.   Multiple Vitamins-Minerals (ONE A DAY MEN 50 PLUS) TABS Take 1 tablet by mouth daily.   No current facility-administered medications on file prior to visit.     No Known Allergies  Review Of Systems:  Constitutional:   No  weight loss, night sweats,  Fevers, chills, fatigue, or  lassitude.  HEENT:   No headaches,  Difficulty swallowing,  Tooth/dental problems, or  Sore throat,                No sneezing, itching, ear ache, nasal congestion,  post nasal drip,   CV:  No chest pain,  Orthopnea, PND, swelling in lower extremities, anasarca, dizziness, palpitations, syncope.   GI  No heartburn, indigestion, abdominal pain, nausea, vomiting, diarrhea, change in bowel habits, loss of appetite, bloody stools.   Resp: No shortness of breath with exertion or at rest.  No excess mucus, no productive cough,  No non-productive cough,  No coughing up of blood.  No change in color of mucus.  No wheezing.  No chest wall deformity  Skin: no rash or lesions.  GU: no dysuria, change in color of urine, no urgency or frequency.  No flank pain, no hematuria   MS:  No joint pain or swelling.  No decreased range of motion.  No back pain.  Psych:  No change in mood or affect. No depression or anxiety.  No memory loss.   Vital Signs There were no vitals taken for this visit.   Physical Exam:  General- No distress,  A&Ox3 ENT: No sinus tenderness, TM clear, pale nasal mucosa, no oral exudate,no post nasal  drip, no LAN Cardiac: S1, S2, regular rate and rhythm, no murmur Chest: No wheeze/ rales/ dullness; no accessory muscle use, no nasal flaring, no sternal retractions Abd.: Soft Non-tender Ext: No clubbing cyanosis, edema Neuro:  normal strength Skin: No rashes, warm and dry Psych: normal mood and behavior  Physical Exam    Assessment/Plan  Assessment and Plan Assessment & Plan        Daniel Long JULIANNA Lites, NP 10/10/2024  3:57 PM

## 2024-10-12 ENCOUNTER — Ambulatory Visit: Admitting: Acute Care

## 2024-10-12 ENCOUNTER — Encounter: Payer: Self-pay | Admitting: Acute Care

## 2024-10-12 ENCOUNTER — Telehealth: Payer: Self-pay

## 2024-10-12 VITALS — BP 124/79 | HR 73 | Temp 98.3°F | Ht 68.0 in | Wt 172.0 lb

## 2024-10-12 DIAGNOSIS — C3480 Malignant neoplasm of overlapping sites of unspecified bronchus and lung: Secondary | ICD-10-CM

## 2024-10-12 DIAGNOSIS — F17200 Nicotine dependence, unspecified, uncomplicated: Secondary | ICD-10-CM

## 2024-10-12 DIAGNOSIS — F17210 Nicotine dependence, cigarettes, uncomplicated: Secondary | ICD-10-CM | POA: Diagnosis not present

## 2024-10-12 DIAGNOSIS — C3432 Malignant neoplasm of lower lobe, left bronchus or lung: Secondary | ICD-10-CM

## 2024-10-12 DIAGNOSIS — Z9889 Other specified postprocedural states: Secondary | ICD-10-CM

## 2024-10-12 DIAGNOSIS — C3411 Malignant neoplasm of upper lobe, right bronchus or lung: Secondary | ICD-10-CM

## 2024-10-12 DIAGNOSIS — R9389 Abnormal findings on diagnostic imaging of other specified body structures: Secondary | ICD-10-CM

## 2024-10-12 DIAGNOSIS — C3490 Malignant neoplasm of unspecified part of unspecified bronchus or lung: Secondary | ICD-10-CM

## 2024-10-12 DIAGNOSIS — R942 Abnormal results of pulmonary function studies: Secondary | ICD-10-CM

## 2024-10-12 DIAGNOSIS — R911 Solitary pulmonary nodule: Secondary | ICD-10-CM

## 2024-10-12 NOTE — Patient Instructions (Addendum)
 It is god to see you today. I am glad you did well after the biopsies. We have reviewed your biopsy results. They were positive for adenocarcinoma, this is a non small cell lung cancer,  of the right upper lobe and left lower lobe.  The lymph node biopsies were negative for malignancy. I have ordered an MRI brain to complete staging. I have also ordered Pulmonary Function testing to see if you are a surgical candidate. You  are scheduled 11/ 11/2024 at 2 pm. At [facility]. I have referred you to medical oncology as well as radiation oncology for treatment options. If your PFT's support surgical intervention, we may be able to consider surgery as an option. Call if you need us  for anything. Good luck with treatment. Please contact office for sooner follow up if symptoms do not improve or worsen or seek emergency care

## 2024-10-13 NOTE — Anesthesia Postprocedure Evaluation (Signed)
 Anesthesia Post Note  Patient: Koren Officer  Procedure(s) Performed: VIDEO BRONCHOSCOPY WITH ENDOBRONCHIAL NAVIGATION (Right) BRONCHOSCOPY, WITH EBUS BRONCHOSCOPY, WITH NEEDLE ASPIRATION BIOPSY BRONCHOSCOPY, WITH BIOPSY     Patient location during evaluation: PACU Anesthesia Type: General Level of consciousness: awake and alert Pain management: pain level controlled Vital Signs Assessment: post-procedure vital signs reviewed and stable Respiratory status: spontaneous breathing, nonlabored ventilation and respiratory function stable Cardiovascular status: blood pressure returned to baseline and stable Postop Assessment: no apparent nausea or vomiting Anesthetic complications: no   No notable events documented.                 Leona Alen

## 2024-10-19 ENCOUNTER — Ambulatory Visit (HOSPITAL_BASED_OUTPATIENT_CLINIC_OR_DEPARTMENT_OTHER)

## 2024-10-19 DIAGNOSIS — C3480 Malignant neoplasm of overlapping sites of unspecified bronchus and lung: Secondary | ICD-10-CM

## 2024-10-19 LAB — PULMONARY FUNCTION TEST
DL/VA % pred: 121 %
DL/VA: 5.01 ml/min/mmHg/L
DLCO cor % pred: 106 %
DLCO cor: 26.34 ml/min/mmHg
DLCO unc % pred: 111 %
DLCO unc: 27.52 ml/min/mmHg
FEF 25-75 Post: 2.32 L/s
FEF 25-75 Pre: 2.39 L/s
FEF2575-%Change-Post: -3 %
FEF2575-%Pred-Post: 97 %
FEF2575-%Pred-Pre: 100 %
FEV1-%Change-Post: -1 %
FEV1-%Pred-Post: 79 %
FEV1-%Pred-Pre: 80 %
FEV1-Post: 2.45 L
FEV1-Pre: 2.49 L
FEV1FVC-%Change-Post: -1 %
FEV1FVC-%Pred-Pre: 106 %
FEV6-%Change-Post: 0 %
FEV6-%Pred-Post: 79 %
FEV6-%Pred-Pre: 79 %
FEV6-Post: 3.14 L
FEV6-Pre: 3.14 L
FEV6FVC-%Change-Post: 0 %
FEV6FVC-%Pred-Post: 105 %
FEV6FVC-%Pred-Pre: 105 %
FVC-%Change-Post: 0 %
FVC-%Pred-Post: 75 %
FVC-%Pred-Pre: 75 %
FVC-Post: 3.15 L
FVC-Pre: 3.16 L
Post FEV1/FVC ratio: 78 %
Post FEV6/FVC ratio: 100 %
Pre FEV1/FVC ratio: 79 %
Pre FEV6/FVC Ratio: 100 %
RV % pred: 133 %
RV: 3.07 L
TLC % pred: 96 %
TLC: 6.37 L

## 2024-10-19 NOTE — Progress Notes (Signed)
 Full PFT performed today.

## 2024-10-19 NOTE — Patient Instructions (Signed)
 Full PFT performed today.

## 2024-10-20 ENCOUNTER — Ambulatory Visit (INDEPENDENT_AMBULATORY_CARE_PROVIDER_SITE_OTHER)
Admission: RE | Admit: 2024-10-20 | Discharge: 2024-10-20 | Disposition: A | Discharge status: Home / Self Care | Source: Ambulatory Visit | Attending: Acute Care | Admitting: Acute Care

## 2024-10-20 ENCOUNTER — Other Ambulatory Visit: Payer: Self-pay

## 2024-10-20 DIAGNOSIS — C3490 Malignant neoplasm of unspecified part of unspecified bronchus or lung: Secondary | ICD-10-CM

## 2024-10-20 DIAGNOSIS — Z8673 Personal history of transient ischemic attack (TIA), and cerebral infarction without residual deficits: Secondary | ICD-10-CM | POA: Diagnosis not present

## 2024-10-20 DIAGNOSIS — Q048 Other specified congenital malformations of brain: Secondary | ICD-10-CM | POA: Diagnosis not present

## 2024-10-20 MED ORDER — GADOBUTROL 1 MMOL/ML IV SOLN
8.0000 mL | Freq: Once | INTRAVENOUS | Status: AC | PRN
  Administered 2024-10-20: 8 mL via INTRAVENOUS

## 2024-10-20 NOTE — Progress Notes (Signed)
 The proposed treatment discussed in conference is for discussion purpose only and is not a binding recommendation.  The patients have not been physically examined, or presented with their treatment options.  Therefore, final treatment plans cannot be decided.

## 2024-10-26 NOTE — Progress Notes (Signed)
 [facility] at [facility] [address] [place],  [place]  [address] [phone]  Clinic Day:  10/27/2024  Referring physician: Benjamine [name] DASEN, NP   HISTORY OF PRESENT ILLNESS:  The patient is a 68 y.o. male who I was asked to consult upon for newly diagnosed lung cancer.   Due to his prominent smoking history, the patient has been undergoing screening chest CTs.  His most recent study revealed a right upper lobe lung mass, which was proven per a follow-up PET scan to be hypermetabolically active, with an SUV of 8.7.  Furthermore, a right perihilar lymph node was hypermetabolic, with an SUV of 8.7.  This gentleman's PET scan did not show any other abnormal findings.  However, when the patient underwent a bronchoscopy, mapping of his left lower lobe showed a very vague ground glass lesion that was biopsy-proven to be adenocarcinoma.  This gentleman's right upper lobe lung lesion was also biopsied, which came back consistent with adenocarcinoma.  Interestingly enough, this gentleman's very hypermetabolic perihilar lymph node came back negative for disease.  The patient comes in today to go over his biopsy results and their implications.  The patient denies ever having baseline shortness of breath, hemoptysis, or weight loss over this calendar year which ever alerted him to lung cancer being present.  The patient has smoked at least a pack of cigarettes daily for 53 years.  PAST MEDICAL HISTORY:   Past Medical History:  Diagnosis Date   Hyperlipidemia    Hypertension    Lung cancer (HCC)     PAST SURGICAL HISTORY:   Past Surgical History:  Procedure Laterality Date   BRONCHIAL BIOPSY  10/06/2024   Procedure: BRONCHOSCOPY, WITH BIOPSY;  Surgeon: Kara Dorn NOVAK, MD;  Location: [facility] ENDOSCOPY;  Service: Pulmonary;;   BRONCHIAL NEEDLE ASPIRATION BIOPSY  10/06/2024   Procedure: BRONCHOSCOPY, WITH NEEDLE ASPIRATION BIOPSY;  Surgeon: Kara Dorn NOVAK, MD;  Location: [facility]  ENDOSCOPY;  Service: Pulmonary;;   HAND SURGERY Left    LYMPH NODE BIOPSY     VIDEO BRONCHOSCOPY WITH ENDOBRONCHIAL NAVIGATION Right 10/06/2024   Procedure: VIDEO BRONCHOSCOPY WITH ENDOBRONCHIAL NAVIGATION;  Surgeon: Kara Dorn NOVAK, MD;  Location: [facility] ENDOSCOPY;  Service: Pulmonary;  Laterality: Right;   VIDEO BRONCHOSCOPY WITH ENDOBRONCHIAL ULTRASOUND N/A 10/06/2024   Procedure: BRONCHOSCOPY, WITH EBUS;  Surgeon: Kara Dorn NOVAK, MD;  Location: [facility] ENDOSCOPY;  Service: Pulmonary;  Laterality: N/A;    CURRENT MEDICATIONS:   Current Outpatient Medications  Medication Sig Dispense Refill   amLODipine-valsartan (EXFORGE) 10-320 MG tablet Take 1 tablet by mouth daily.     aspirin 81 MG tablet Take 81 mg by mouth daily.     atorvastatin (LIPITOR) 20 MG tablet Take 20 mg by mouth daily.     fluticasone (FLONASE) 50 MCG/ACT nasal spray Place 1-2 sprays into both nostrils daily as needed for allergies.     levocetirizine (XYZAL) 5 MG tablet Take 5 mg by mouth daily as needed.     Multiple Vitamins-Minerals (ONE A DAY MEN 50 PLUS) TABS Take 1 tablet by mouth daily.     No current facility-administered medications for this visit.    ALLERGIES:  No Known Allergies  FAMILY HISTORY:   Family History  Problem Relation Age of Onset   Heart disease Mother    Heart disease Father    Hypertension Father    Heart disease Brother     SOCIAL HISTORY:  The patient was born and raised in [place].  He lives [address] [place] by himself.  He is single, with 2 children and 2 grandchildren.  He was a soil scientist for 40 years.  His prominent smoking history is as mentioned per HPI.  He admits to previous alcohol use.  REVIEW OF SYSTEMS:  Review of Systems  Constitutional:  Negative for fatigue, fever and unexpected weight change.  Respiratory:  Negative for chest tightness, cough, hemoptysis and shortness of breath.   Cardiovascular:  Negative for chest pain and palpitations.   Gastrointestinal:  Negative for abdominal distention, abdominal pain, blood in stool, constipation, diarrhea, nausea and vomiting.  Genitourinary:  Negative for dysuria, frequency and hematuria.   Musculoskeletal:  Negative for arthralgias, back pain and myalgias.  Skin:  Negative for itching and rash.  Neurological:  Negative for dizziness, headaches and light-headedness.  Psychiatric/Behavioral:  Negative for depression and suicidal ideas. The patient is not nervous/anxious.      PHYSICAL EXAM:  Blood pressure 139/80, pulse 84, temperature 98 F (36.7 C), temperature source Oral, resp. rate 18, height 5' 9.29 (1.76 m), weight 175 lb 11.2 oz (79.7 kg), SpO2 96%. Wt Readings from Last 3 Encounters:  10/27/24 175 lb 11.2 oz (79.7 kg)  10/27/24 175 lb 11.2 oz (79.7 kg)  10/12/24 172 lb (78 kg)   Body mass index is 25.73 kg/m. Performance status (ECOG): 0 - Asymptomatic Physical Exam Constitutional:      Appearance: Normal appearance. He is not ill-appearing.  HENT:     Mouth/Throat:     Mouth: Mucous membranes are moist.     Pharynx: Oropharynx is clear. No oropharyngeal exudate or posterior oropharyngeal erythema.  Cardiovascular:     Rate and Rhythm: Normal rate and regular rhythm.     Heart sounds: No murmur heard.    No friction rub. No gallop.  Pulmonary:     Effort: Pulmonary effort is normal. No respiratory distress.     Breath sounds: Normal breath sounds. No wheezing, rhonchi or rales.  Abdominal:     General: Bowel sounds are normal. There is no distension.     Palpations: Abdomen is soft. There is no mass.     Tenderness: There is no abdominal tenderness.  Musculoskeletal:        General: No swelling.     Right lower leg: No edema.     Left lower leg: No edema.  Lymphadenopathy:     Cervical: No cervical adenopathy.     Upper Body:     Right upper body: No supraclavicular or axillary adenopathy.     Left upper body: No supraclavicular or axillary adenopathy.      Lower Body: No right inguinal adenopathy. No left inguinal adenopathy.  Skin:    General: Skin is warm.     Coloration: Skin is not jaundiced.     Findings: No lesion or rash.  Neurological:     General: No focal deficit present.     Mental Status: He is alert and oriented to person, place, and time. Mental status is at baseline.  Psychiatric:        Mood and Affect: Mood normal.        Behavior: Behavior normal.        Thought Content: Thought content normal.    PATHOLOGY: FINAL MICROSCOPIC DIAGNOSIS:  A. LUNG, RUL, FINE NEEDLE ASPIRATION  BIOPSIES:  - Adenocarcinoma  - See comment.   COMMENT:  The specimen shows scant atypical epithelioid cells. Immunohistochemical  stains show the cells are positive for  TTF-1 and are negative for p40.  The findings are consistent with adenocarcinoma.   B. LUNG, LLL TARGET #2, FINE NEEDLE ASPIRATION  BIOPSIES:  - Adenocarcinoma  - See comment   C. LUNG, LLL TARGET #2, BRUSHING:  - Rare atypical cells present  - Predominantly blood   LABS:      Latest Ref Rng & Units 10/27/2024    2:45 PM 10/06/2024    9:20 AM  CBC  WBC 4.0 - 10.5 K/uL 8.8  9.9   Hemoglobin 13.0 - 17.0 g/dL 84.3  83.6   Hematocrit 39.0 - 52.0 % 45.7  47.0   Platelets 150 - 400 K/uL 248  252       Latest Ref Rng & Units 10/27/2024    2:45 PM 10/06/2024    9:20 AM  CMP  Glucose 70 - 99 mg/dL 883  889   BUN 8 - 23 mg/dL 14  11   Creatinine 9.38 - 1.24 mg/dL 9.36  9.37   Sodium 864 - 145 mmol/L 141  139   Potassium 3.5 - 5.1 mmol/L 4.1  3.9   Chloride 98 - 111 mmol/L 105  107   CO2 22 - 32 mmol/L 28  23   Calcium 8.9 - 10.3 mg/dL 9.0  8.7   Total Protein 6.5 - 8.1 g/dL 6.9    Total Bilirubin 0.0 - 1.2 mg/dL 0.3    Alkaline Phos 38 - 126 U/L 95    AST 15 - 41 U/L 33    ALT 0 - 44 U/L 53      STUDIES:  MR BRAIN W WO CONTRAST Result Date: 10/23/2024 EXAM: MRI BRAIN WITH AND WITHOUT CONTRAST 10/20/2024 11:50:40 AM TECHNIQUE: Multiplanar multisequence  MRI of the head/brain was performed with and without the administration of intravenous contrast. CONTRAST: 8 mL of Gadavist . COMPARISON: None available. CLINICAL HISTORY: Non-small cell lung cancer. 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 No acute intracranial hemorrhage. No mass effect or midline shift. No hydrocephalus. The sella is unremarkable. Normal flow voids. No mass or abnormal enhancement. ORBITS: No acute abnormality. SINUSES: No acute abnormality. BONES AND SOFT TISSUES: Normal bone marrow signal and enhancement. No acute soft tissue abnormality. IMPRESSION: 1. No acute intracranial abnormality. No evidence for metastatic disease to the brain. 2. 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 Electronically signed by: Lonni Necessary MD 10/23/2024 06:46 PM EST RP Workstation: [id number]    ASSESSMENT & PLAN:  A 68 y.o. male who I was asked to consult upon for bilateral lung adenocarcinomas. This gentleman's case is particularly interesting as the left lower lobe lung lesion that was biopsy-proven to be adenocarcinoma was very vaguely appreciated on all prior scans.  Even more perplexing is this gentleman's right perihilar lymph node that was very hypermetabolic, but was negative per biopsy.  I did speak with pulmonology regarding this gentlemen's case.  My concern is that this gentleman may have a false negative right perihilar lymph node biopsy.  It is very important to ensure this lymph node is definitively negative.  If there is positive disease in this lymph node, it would require him to undergo chemoradiation.  After speaking with pulmonology, they agreed to have this right perihilar lymph node rebiopsied.  They will be getting in contact with the patient to reschedule a biopsy of the node in question.  I will tentatively see this  patient back in 2-3  weeks to go over the biopsy results from his bronchoscopy, which will be used to formulate his definitive treatment plan.  As things stand currently, the belief is he has a very early stage I left lower lobe lung  adenocarcinoma and a stage IIA right upper lobe lung adenocarcinoma.  The repeat right perihilar lymph node biopsy will ultimately confirm whether he has stage IIA disease or not.  The patient understands all the plans discussed today and is in agreement with them.  I do appreciate Benjamine [name] DASEN, NP for his new consult.   Tanetta Fuhriman DELENA Kerns, MD

## 2024-10-27 ENCOUNTER — Inpatient Hospital Stay

## 2024-10-27 ENCOUNTER — Encounter: Payer: Self-pay | Admitting: Oncology

## 2024-10-27 ENCOUNTER — Other Ambulatory Visit: Payer: Self-pay

## 2024-10-27 ENCOUNTER — Inpatient Hospital Stay: Attending: Oncology | Admitting: Oncology

## 2024-10-27 ENCOUNTER — Ambulatory Visit
Admission: RE | Admit: 2024-10-27 | Discharge: 2024-10-27 | Disposition: A | Discharge status: Home / Self Care | Source: Ambulatory Visit | Attending: Radiation Oncology | Admitting: Radiation Oncology

## 2024-10-27 ENCOUNTER — Encounter: Payer: Self-pay | Admitting: Radiation Oncology

## 2024-10-27 VITALS — BP 139/80 | HR 84 | Temp 98.0°F | Resp 18 | Ht 69.29 in | Wt 175.7 lb

## 2024-10-27 DIAGNOSIS — Z87891 Personal history of nicotine dependence: Secondary | ICD-10-CM | POA: Insufficient documentation

## 2024-10-27 DIAGNOSIS — E785 Hyperlipidemia, unspecified: Secondary | ICD-10-CM | POA: Diagnosis not present

## 2024-10-27 DIAGNOSIS — Z7982 Long term (current) use of aspirin: Secondary | ICD-10-CM | POA: Insufficient documentation

## 2024-10-27 DIAGNOSIS — R911 Solitary pulmonary nodule: Secondary | ICD-10-CM

## 2024-10-27 DIAGNOSIS — C3411 Malignant neoplasm of upper lobe, right bronchus or lung: Secondary | ICD-10-CM | POA: Diagnosis not present

## 2024-10-27 DIAGNOSIS — C3490 Malignant neoplasm of unspecified part of unspecified bronchus or lung: Secondary | ICD-10-CM

## 2024-10-27 DIAGNOSIS — I10 Essential (primary) hypertension: Secondary | ICD-10-CM | POA: Insufficient documentation

## 2024-10-27 DIAGNOSIS — F17210 Nicotine dependence, cigarettes, uncomplicated: Secondary | ICD-10-CM | POA: Insufficient documentation

## 2024-10-27 DIAGNOSIS — Z79899 Other long term (current) drug therapy: Secondary | ICD-10-CM | POA: Insufficient documentation

## 2024-10-27 DIAGNOSIS — C3481 Malignant neoplasm of overlapping sites of right bronchus and lung: Secondary | ICD-10-CM | POA: Insufficient documentation

## 2024-10-27 HISTORY — DX: Hyperlipidemia, unspecified: E78.5

## 2024-10-27 LAB — CBC WITH DIFFERENTIAL (CANCER CENTER ONLY)
Abs Immature Granulocytes: 0.07 K/uL (ref 0.00–0.07)
Basophils Absolute: 0 K/uL (ref 0.0–0.1)
Basophils Relative: 1 %
Eosinophils Absolute: 0.2 K/uL (ref 0.0–0.5)
Eosinophils Relative: 2 %
HCT: 45.7 % (ref 39.0–52.0)
Hemoglobin: 15.6 g/dL (ref 13.0–17.0)
Immature Granulocytes: 1 %
Lymphocytes Relative: 27 %
Lymphs Abs: 2.3 K/uL (ref 0.7–4.0)
MCH: 32.2 pg (ref 26.0–34.0)
MCHC: 34.1 g/dL (ref 30.0–36.0)
MCV: 94.4 fL (ref 80.0–100.0)
Monocytes Absolute: 0.7 K/uL (ref 0.1–1.0)
Monocytes Relative: 8 %
Neutro Abs: 5.4 K/uL (ref 1.7–7.7)
Neutrophils Relative %: 61 %
Platelet Count: 248 K/uL (ref 150–400)
RBC: 4.84 MIL/uL (ref 4.22–5.81)
RDW: 12.7 % (ref 11.5–15.5)
WBC Count: 8.8 K/uL (ref 4.0–10.5)
nRBC: 0 % (ref 0.0–0.2)

## 2024-10-27 LAB — CMP (CANCER CENTER ONLY)
ALT: 53 U/L — ABNORMAL HIGH (ref 0–44)
AST: 33 U/L (ref 15–41)
Albumin: 3.8 g/dL (ref 3.5–5.0)
Alkaline Phosphatase: 95 U/L (ref 38–126)
Anion gap: 8 (ref 5–15)
BUN: 14 mg/dL (ref 8–23)
CO2: 28 mmol/L (ref 22–32)
Calcium: 9 mg/dL (ref 8.9–10.3)
Chloride: 105 mmol/L (ref 98–111)
Creatinine: 0.63 mg/dL (ref 0.61–1.24)
GFR, Estimated: >60 mL/min (ref >60)
Glucose, Bld: 116 mg/dL — ABNORMAL HIGH (ref 70–99)
Potassium: 4.1 mmol/L (ref 3.5–5.1)
Sodium: 141 mmol/L (ref 135–145)
Total Bilirubin: 0.3 mg/dL (ref 0.0–1.2)
Total Protein: 6.9 g/dL (ref 6.5–8.1)

## 2024-10-28 ENCOUNTER — Telehealth: Payer: Self-pay

## 2024-10-28 NOTE — Telephone Encounter (Unsigned)
 Copied from CRM [id number]. Topic: General - Other >> Oct 27, 2024  3:12 PM Russell PARAS wrote: Reason for CRM:   Dr. Valaria Kerns is contacting office to speak with Dewald concerning their mutual patient. Contacted [facility], and spoke with Debbie who advised [facility] is pt's main provider and is not in clinic today.   Requested call back  CB# (Tammy, Dr. Trudi nurse)  [phone]  OR CB# (Dr. Trudi cell)  [phone]

## 2024-10-30 NOTE — Progress Notes (Signed)
 Radiation Oncology         [phone] ________________________________  Name: Daniel Long        MRN: [id number]  Date of Service: 10/27/2024 DOB: 12-28-1955  RR:Mnluy, [name] DASEN, NP  [facility] [name], NP     REFERRING PHYSICIAN: Benjamine [name] DASEN, NP   DIAGNOSIS: Adenocarcinoma of the lung   HISTORY OF PRESENT ILLNESS: Daniel Long is a 68 y.o. male seen today regarding his recent diagnosis of adenocarcinoma of the lung.  He was initially found to have right upper lobe lesion on screening lung CT performed earlier this year.  On 09/09/2024, he underwent PET/CT imaging, revealing a hypermetabolic 11 mm right upper lobe nodule, with an SUV max of 8.7, felt to be suspicious for carcinoma.  Also seen was a hypermetabolic right perihilar lymph node, with SUV max of 8.7.  Lastly, he underwent chest CT scan on 09/20/2024, again demonstrating a pulmonary nodule in the right upper lobe.  He has undergone bronchoscopy, with pathology revealing adenocarcinoma in the left lower lobe, as well as the right upper lobe.  All other sampled areas, including the right hilar lymph node, were negative for carcinoma.  Consultation is requested regarding the potential role of radiation in his care.    PREVIOUS RADIATION THERAPY: No   PAST MEDICAL HISTORY:  Past Medical History:  Diagnosis Date   Hyperlipidemia    Hypertension    Lung cancer (HCC)        PAST SURGICAL HISTORY: Past Surgical History:  Procedure Laterality Date   BRONCHIAL BIOPSY  10/06/2024   Procedure: BRONCHOSCOPY, WITH BIOPSY;  Surgeon: Kara Dorn NOVAK, MD;  Location: [facility] ENDOSCOPY;  Service: Pulmonary;;   BRONCHIAL NEEDLE ASPIRATION BIOPSY  10/06/2024   Procedure: BRONCHOSCOPY, WITH NEEDLE ASPIRATION BIOPSY;  Surgeon: Kara Dorn NOVAK, MD;  Location: [facility] ENDOSCOPY;  Service: Pulmonary;;   HAND SURGERY Left    LYMPH NODE BIOPSY     VIDEO BRONCHOSCOPY WITH ENDOBRONCHIAL NAVIGATION Right 10/06/2024   Procedure: VIDEO BRONCHOSCOPY WITH  ENDOBRONCHIAL NAVIGATION;  Surgeon: Kara Dorn NOVAK, MD;  Location: [facility] ENDOSCOPY;  Service: Pulmonary;  Laterality: Right;   VIDEO BRONCHOSCOPY WITH ENDOBRONCHIAL ULTRASOUND N/A 10/06/2024   Procedure: BRONCHOSCOPY, WITH EBUS;  Surgeon: Kara Dorn NOVAK, MD;  Location: [facility] ENDOSCOPY;  Service: Pulmonary;  Laterality: N/A;     FAMILY HISTORY:  Family History  Problem Relation Age of Onset   Heart disease Mother    Heart disease Father    Hypertension Father    Heart disease Brother      SOCIAL HISTORY:  reports that he quit smoking about 2 weeks ago. His smoking use included cigarettes. He started smoking about 53 years ago. He has a 53 pack-year smoking history. He has never used smokeless tobacco. He reports that he does not currently use alcohol. He reports current drug use. Drug: Marijuana.  He lives in [place].  He is retired.   ALLERGIES: Patient has no known allergies.   MEDICATIONS:  Current Outpatient Medications  Medication Sig Dispense Refill   amLODipine-valsartan (EXFORGE) 10-320 MG tablet Take 1 tablet by mouth daily.     aspirin 81 MG tablet Take 81 mg by mouth daily.     atorvastatin (LIPITOR) 20 MG tablet Take 20 mg by mouth daily.     fluticasone (FLONASE) 50 MCG/ACT nasal spray Place 1-2 sprays into both nostrils daily as needed for allergies.     levocetirizine (XYZAL) 5 MG tablet Take 5 mg by mouth daily as needed.  Multiple Vitamins-Minerals (ONE A DAY MEN 50 PLUS) TABS Take 1 tablet by mouth daily.     No current facility-administered medications for this encounter.     REVIEW OF SYSTEMS: On review of systems, the patient reports that he is doing well overall.  He denies any chest pain, shortness of breath, cough, fevers, chills, night sweats, unintended weight changes.  He denies any bowel or bladder disturbances, and denies abdominal pain, nausea or vomiting.  He denies any new musculoskeletal or joint aches or pains. A complete review of systems is  obtained and is otherwise negative.     PHYSICAL EXAM:  Wt Readings from Last 3 Encounters:  10/27/24 175 lb 11.2 oz (79.7 kg)  10/27/24 175 lb 11.2 oz (79.7 kg)  10/12/24 172 lb (78 kg)   Temp Readings from Last 3 Encounters:  10/27/24 98 F (36.7 C) (Oral)  10/27/24 98 F (36.7 C) (Oral)  10/12/24 98.3 F (36.8 C) (Oral)   BP Readings from Last 3 Encounters:  10/27/24 139/80  10/27/24 139/80  10/12/24 124/79   Pulse Readings from Last 3 Encounters:  10/27/24 84  10/27/24 84  10/12/24 73   Pain Assessment Pain Score: 0-No pain/10  In general this is a well appearing male in no acute distress.  He's alert and oriented x4 and appropriate throughout the examination. Cardiopulmonary assessment is negative for acute distress and he exhibits normal effort.  No cervical or supraclavicular lymphadenopathy is appreciated.  His extremities are without edema.    ECOG = 0  0 - Asymptomatic (Fully active, able to carry on all predisease activities without restriction)  1 - Symptomatic but completely ambulatory (Restricted in physically strenuous activity but ambulatory and able to carry out work of a light or sedentary nature. For example, light housework, office work)  2 - Symptomatic, <50% in bed during the day (Ambulatory and capable of all self care but unable to carry out any work activities. Up and about more than 50% of waking hours)  3 - Symptomatic, >50% in bed, but not bedbound (Capable of only limited self-care, confined to bed or chair 50% or more of waking hours)  4 - Bedbound (Completely disabled. Cannot carry on any self-care. Totally confined to bed or chair)  5 - Death   Raylene MM, Creech RH, Tormey DC, et al. [phone]). Toxicity and response criteria of the [facility]. Am. DOROTHA Bridges. Oncol. 5 (6): 649-55    LABORATORY DATA:  Lab Results  Component Value Date   WBC 8.8 10/27/2024   HGB 15.6 10/27/2024   HCT 45.7 10/27/2024   MCV 94.4  10/27/2024   PLT 248 10/27/2024   Lab Results  Component Value Date   NA 141 10/27/2024   K 4.1 10/27/2024   CL 105 10/27/2024   CO2 28 10/27/2024   Lab Results  Component Value Date   ALT 53 (H) 10/27/2024   AST 33 10/27/2024   ALKPHOS 95 10/27/2024   BILITOT 0.3 10/27/2024      RADIOGRAPHY: MR BRAIN W WO CONTRAST Result Date: 10/23/2024 EXAM: MRI BRAIN WITH AND WITHOUT CONTRAST 10/20/2024 11:50:40 AM TECHNIQUE: Multiplanar multisequence MRI of the head/brain was performed with and without the administration of intravenous contrast. CONTRAST: 8 mL of Gadavist . COMPARISON: None available. CLINICAL HISTORY: Non-small cell lung cancer. 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 No acute intracranial hemorrhage.  No mass effect or midline shift. No hydrocephalus. The sella is unremarkable. Normal flow voids. No mass or abnormal enhancement. ORBITS: No acute abnormality. SINUSES: No acute abnormality. BONES AND SOFT TISSUES: Normal bone marrow signal and enhancement. No acute soft tissue abnormality. IMPRESSION: 1. No acute intracranial abnormality. No evidence for metastatic disease to the brain. 2. 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 Electronically signed by: Lonni Necessary MD 10/23/2024 06:46 PM EST RP Workstation: [id number]   DG Chest Port 1 View Result Date: 10/06/2024 CLINICAL DATA:  Status post bronchoscopy and biopsy. EXAM: PORTABLE CHEST 1 VIEW COMPARISON:  Chest CT dated 09/20/2024. FINDINGS: No pneumothorax post biopsy. Right upper lobe nodule better seen on the chest CT. No consolidative changes. No pleural effusion. The cardiac silhouette is within normal limits. No acute osseous pathology. IMPRESSION: 1. No pneumothorax post biopsy. 2. Right upper lobe nodule. Electronically Signed   By: Vanetta Chou M.D.   On: 10/06/2024 14:41    DG C-ARM BRONCHOSCOPY Result Date: 10/06/2024 C-ARM BRONCHOSCOPY: Fluoroscopy was utilized by the requesting physician.  No radiographic interpretation.   DG C-Arm 1-60 Min-No Report Result Date: 10/06/2024 Fluoroscopy was utilized by the requesting physician.  No radiographic interpretation.   DG C-Arm 1-60 Min-No Report Result Date: 10/06/2024 Fluoroscopy was utilized by the requesting physician.  No radiographic interpretation.       IMPRESSION/PLAN: 1.  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  Interestingly, none of his imaging studies performed thus far revealed any abnormalities in the left lung.  The biopsy of his right hilar region, which demonstrated increased radiotracer uptake on PET/CT scanning, was negative.  Of note, the patient is being seen in consultation by Dr. Ezzard later today.  We may need to contact the referring pulmonologist to ascertain what exactly was biopsied in the left lung, as there is no imaging correlate that I can see.  If he is truly felt to have involvement of his right hilum, external beam radiation, with concurrent chemotherapy, will likely be recommended.  If his right hilar uptake, which was negative on biopsy, is not felt to represent actual carcinoma involvement, he may be candidate for SBRT.   I will await the results of his consultation with Dr. Ezzard, and schedule follow-up accordingly.  He expressed understanding.  In a visit lasting 60 minutes, greater than 50% of the time was spent face to face discussing the patient's condition, in preparation for the discussion, and coordinating the patient's care.    JOMARIE LYNWOOD LABOR., MD    **Disclaimer: This note was dictated with voice recognition software. Similar sounding words can inadvertently be transcribed and this note may contain transcription errors which may not have been corrected upon publication of note.**

## 2024-10-31 ENCOUNTER — Other Ambulatory Visit: Payer: Self-pay | Admitting: Student in an Organized Health Care Education/Training Program

## 2024-10-31 ENCOUNTER — Telehealth: Payer: Self-pay | Admitting: Pulmonary Disease

## 2024-10-31 DIAGNOSIS — C3480 Malignant neoplasm of overlapping sites of unspecified bronchus and lung: Secondary | ICD-10-CM | POA: Insufficient documentation

## 2024-10-31 NOTE — Telephone Encounter (Signed)
 Copied from CRM #[id number]. Topic: Appointments - Scheduling Inquiry for Clinic >> Oct 31, 2024  1:56 PM Lavanda D wrote: Reason for CRM: Patient is calling to follow-up on scheduling for an ultrasound. No order showing in chart but patient mentioned it is coming from Dr. Kara. Please call patient back to advise. He said that Dr. Kara mentioned 12/2 as a possible date - He said that he would like a callback to confirm this is accurate.

## 2024-10-31 NOTE — Telephone Encounter (Signed)
 Lm x1 for the patient.

## 2024-10-31 NOTE — Telephone Encounter (Signed)
 Reviewed case with Dr. Ezzard. Will plan to have patient scheduled for repeat EBUS to assess PET avid mediastinal lymph nodes.  Dorn Chill, MD [facility] Office: [phone]   See Amion for personal pager PCCM on call pager [phone] until 7pm. Please call Elink 7p-7a. [phone]

## 2024-10-31 NOTE — Telephone Encounter (Signed)
 Bronchoscopy with EBUS 11/08/2024 @12 :30PM Lung Nodule 68347, [id number]  Daniel Long please see bronch info.

## 2024-11-01 NOTE — Telephone Encounter (Signed)
 For the codes 68347, [id number] Prior Auth Not Required Refer # [id number]

## 2024-11-01 NOTE — Telephone Encounter (Signed)
 I have notified the patient. Nothing further needed.

## 2024-11-01 NOTE — Telephone Encounter (Signed)
 Copied from CRM #[id number]. Topic: General - Other >> Oct 31, 2024  4:20 PM Rilla B wrote: Reason for CRM: Patient returning cal to [place].  Please call patient [phone].  Done

## 2024-11-07 ENCOUNTER — Encounter
Admission: RE | Admit: 2024-11-07 | Discharge: 2024-11-07 | Disposition: A | Discharge status: Home / Self Care | Source: Ambulatory Visit | Attending: Student in an Organized Health Care Education/Training Program | Admitting: Student in an Organized Health Care Education/Training Program

## 2024-11-07 ENCOUNTER — Other Ambulatory Visit: Payer: Self-pay

## 2024-11-07 HISTORY — DX: Nicotine dependence, unspecified, uncomplicated: F17.200

## 2024-11-07 HISTORY — DX: Personal history of urinary calculi: Z87.442

## 2024-11-07 HISTORY — DX: Malignant neoplasm of overlapping sites of unspecified bronchus and lung: C34.80

## 2024-11-07 HISTORY — DX: Solitary pulmonary nodule: R91.1

## 2024-11-07 MED ORDER — ORAL CARE MOUTH RINSE: 15.0000 mL | Freq: Once | OROMUCOSAL | Status: AC

## 2024-11-07 MED ORDER — CHLORHEXIDINE GLUCONATE 0.12 % MT SOLN
15.0000 mL | Freq: Once | OROMUCOSAL | Status: AC
  Administered 2024-11-08: 15 mL via OROMUCOSAL

## 2024-11-07 MED ORDER — LACTATED RINGERS IV SOLN: INTRAVENOUS | Status: DC

## 2024-11-07 NOTE — Patient Instructions (Addendum)
 Your procedure is scheduled on: Tuesday 11/08/24  Report to the Registration Desk on the 1st floor of the [facility]. To find out your arrival time, please call [phone] between 1PM - 3PM on: Monday 11/07/24 If your arrival time is 6:00 am, do not arrive before that time as the [facility] entrance doors do not open until 6:00 am.  REMEMBER: Instructions that are not followed completely may result in serious medical risk, up to and including death; or upon the discretion of your surgeon and anesthesiologist your surgery may need to be rescheduled.  Do not eat food or drink fluids after midnight the night before surgery.  No gum chewing or hard candies.   One week prior to surgery: Stop Anti-inflammatories (NSAIDS) such as Advil, Aleve, Ibuprofen, Motrin, Naproxen, Naprosyn and Aspirin based products such as Excedrin, Goody's Powder, BC Powder. Stop ANY OVER THE COUNTER supplements until after surgery. Multiple Vitamins-Minerals (ONE A DAY MEN 50 PLUS) TABS   You may however, continue to take Tylenol  if needed for pain up until the day of surgery.  Do not take aspirin 81 MG the morning of your procedure.  Continue taking all of your other prescription medications up until the day of surgery.  ON THE DAY OF SURGERY ONLY TAKE THESE MEDICATIONS WITH SIPS OF WATER:  None   fluticasone (FLONASE) 50 MCG/ACT nasal spray (if needed)  No Alcohol for 24 hours before or after surgery.  No Smoking including e-cigarettes for 24 hours before surgery.  No chewable tobacco products for at least 6 hours before surgery.  No nicotine patches on the day of surgery.  Do not use any recreational drugs for at least a week (preferably 2 weeks) before your surgery.  Please be advised that the combination of cocaine and anesthesia may have negative outcomes, up to and including death. If you test positive for cocaine, your surgery will be cancelled.  On the morning of surgery brush your teeth  with toothpaste and water, you may rinse your mouth with mouthwash if you wish. Do not swallow any toothpaste or mouthwash.  Take a fresh shower the morning of your procedure.   Do not wear jewelry, make-up, hairpins, clips or nail polish.  For welded (permanent) jewelry: bracelets, anklets, waist bands, etc.  Please have this removed prior to surgery.  If it is not removed, there is a chance that hospital personnel will need to cut it off on the day of surgery.  Do not wear lotions, powders, or perfumes.   Do not shave body hair from the neck down 48 hours before surgery.  Contact lenses, hearing aids and dentures may not be worn into surgery.  Do not bring valuables to the hospital. [facility] is not responsible for any missing/lost belongings or valuables.    Notify your doctor if there is any change in your medical condition (cold, fever, infection).  Wear comfortable clothing (specific to your surgery type) to the hospital.  After surgery, you can help prevent lung complications by doing breathing exercises.  Take deep breaths and cough every 1-2 hours. Your doctor may order a device called an Incentive Spirometer to help you take deep breaths. When coughing or sneezing, hold a pillow firmly against your incision with both hands. This is called "splinting." Doing this helps protect your incision. It also decreases belly discomfort.  If you are being admitted to the hospital overnight, leave your suitcase in the car. After surgery it may be brought to your room.  In case of increased patient census, it may be necessary for you, the patient, to continue your postoperative care in the Same Day Surgery department.  If you are being discharged the day of surgery, you will not be allowed to drive home. You will need a responsible individual to drive you home and stay with you for 24 hours after surgery.   If you are taking public transportation, you will need to have a responsible  individual with you.  Please call the Pre-admissions Testing Dept. at [phone] if you have any questions about these instructions.  Surgery Visitation Policy:  Patients having surgery or a procedure may have two visitors.  Children under the age of 62 must have an adult with them who is not the patient.  Inpatient Visitation:    Visiting hours are 7 a.m. to 8 p.m. Up to four visitors are allowed at one time in a patient room. The visitors may rotate out with other people during the day.  One visitor age 36 or older may stay with the patient overnight and must be in the room by 8 p.m.   Merchandiser, Retail to address health-related social needs:  [email or web address]

## 2024-11-08 ENCOUNTER — Ambulatory Visit: Admitting: Certified Registered"

## 2024-11-08 ENCOUNTER — Ambulatory Visit: Payer: Self-pay | Admitting: Urgent Care

## 2024-11-08 ENCOUNTER — Encounter
Admission: RE | Disposition: A | Discharge status: Home / Self Care | Payer: Self-pay | Source: Home / Self Care | Attending: Student in an Organized Health Care Education/Training Program

## 2024-11-08 ENCOUNTER — Encounter: Payer: Self-pay | Admitting: Student in an Organized Health Care Education/Training Program

## 2024-11-08 ENCOUNTER — Ambulatory Visit

## 2024-11-08 ENCOUNTER — Other Ambulatory Visit: Payer: Self-pay

## 2024-11-08 ENCOUNTER — Ambulatory Visit
Admission: RE | Admit: 2024-11-08 | Discharge: 2024-11-08 | Disposition: A | Attending: Student in an Organized Health Care Education/Training Program | Admitting: Student in an Organized Health Care Education/Training Program

## 2024-11-08 DIAGNOSIS — C3481 Malignant neoplasm of overlapping sites of right bronchus and lung: Secondary | ICD-10-CM | POA: Diagnosis not present

## 2024-11-08 DIAGNOSIS — Z87891 Personal history of nicotine dependence: Secondary | ICD-10-CM | POA: Diagnosis not present

## 2024-11-08 DIAGNOSIS — C3491 Malignant neoplasm of unspecified part of right bronchus or lung: Secondary | ICD-10-CM

## 2024-11-08 DIAGNOSIS — C3480 Malignant neoplasm of overlapping sites of unspecified bronchus and lung: Secondary | ICD-10-CM | POA: Insufficient documentation

## 2024-11-08 DIAGNOSIS — I700 Atherosclerosis of aorta: Secondary | ICD-10-CM | POA: Diagnosis not present

## 2024-11-08 DIAGNOSIS — Z48813 Encounter for surgical aftercare following surgery on the respiratory system: Secondary | ICD-10-CM | POA: Diagnosis not present

## 2024-11-08 DIAGNOSIS — I10 Essential (primary) hypertension: Secondary | ICD-10-CM | POA: Diagnosis not present

## 2024-11-08 DIAGNOSIS — R911 Solitary pulmonary nodule: Secondary | ICD-10-CM | POA: Diagnosis not present

## 2024-11-08 HISTORY — PX: VIDEO BRONCHOSCOPY WITH ENDOBRONCHIAL ULTRASOUND: SHX6177

## 2024-11-08 SURGERY — BRONCHOSCOPY, WITH EBUS
Anesthesia: General | Laterality: Bilateral

## 2024-11-08 MED ORDER — ONDANSETRON HCL 4 MG/2ML IJ SOLN
INTRAMUSCULAR | Status: DC | PRN
  Administered 2024-11-08: 4 mg via INTRAVENOUS

## 2024-11-08 MED ORDER — PROPOFOL 10 MG/ML IV BOLUS
INTRAVENOUS | Status: DC | PRN
  Administered 2024-11-08: 150 mg via INTRAVENOUS
  Administered 2024-11-08: 150 ug/kg/min via INTRAVENOUS

## 2024-11-08 MED ORDER — PROPOFOL 1000 MG/100ML IV EMUL
INTRAVENOUS | Status: AC
  Filled 2024-11-08: qty 300

## 2024-11-08 MED ORDER — METOPROLOL TARTRATE 5 MG/5ML IV SOLN
INTRAVENOUS | Status: DC | PRN
  Administered 2024-11-08: 2 mg via INTRAVENOUS

## 2024-11-08 MED ORDER — LIDOCAINE HCL (CARDIAC) PF 100 MG/5ML IV SOSY
PREFILLED_SYRINGE | INTRAVENOUS | Status: DC | PRN
  Administered 2024-11-08: 100 mg via INTRAVENOUS

## 2024-11-08 MED ORDER — CHLORHEXIDINE GLUCONATE 0.12 % MT SOLN
OROMUCOSAL | Status: AC
  Filled 2024-11-08: qty 15

## 2024-11-08 MED ORDER — EPHEDRINE SULFATE (PRESSORS) 25 MG/5ML IV SOSY
PREFILLED_SYRINGE | INTRAVENOUS | Status: DC | PRN
  Administered 2024-11-08: 10 mg via INTRAVENOUS

## 2024-11-08 MED ORDER — ROCURONIUM BROMIDE 100 MG/10ML IV SOLN
INTRAVENOUS | Status: DC | PRN
  Administered 2024-11-08: 50 mg via INTRAVENOUS

## 2024-11-08 MED ORDER — DEXAMETHASONE SOD PHOSPHATE PF 10 MG/ML IJ SOLN
INTRAMUSCULAR | Status: DC | PRN
  Administered 2024-11-08: 10 mg via INTRAVENOUS

## 2024-11-08 MED ORDER — PHENYLEPHRINE HCL (PRESSORS) 10 MG/ML IV SOLN
INTRAVENOUS | Status: DC | PRN
  Administered 2024-11-08 (×2): 160 ug via INTRAVENOUS

## 2024-11-08 MED ORDER — SUGAMMADEX SODIUM 200 MG/2ML IV SOLN
INTRAVENOUS | Status: DC | PRN
  Administered 2024-11-08: 200 mg via INTRAVENOUS

## 2024-11-08 MED ORDER — FENTANYL CITRATE (PF) 100 MCG/2ML IJ SOLN
INTRAMUSCULAR | Status: AC
  Filled 2024-11-08: qty 2

## 2024-11-08 MED ORDER — GLYCOPYRROLATE 0.2 MG/ML IJ SOLN
INTRAMUSCULAR | Status: DC | PRN
  Administered 2024-11-08: .2 mg via INTRAVENOUS

## 2024-11-08 MED ORDER — FENTANYL CITRATE (PF) 100 MCG/2ML IJ SOLN
INTRAMUSCULAR | Status: DC | PRN
  Administered 2024-11-08: 100 ug via INTRAVENOUS

## 2024-11-08 NOTE — Anesthesia Preprocedure Evaluation (Addendum)
 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 on medications (-) angina (-) Past MI Normal cardiovascular exam Rhythm:Regular Rate:Normal     Neuro/Psych    GI/Hepatic Neg liver ROS,neg GERD  ,,  Endo/Other  negative endocrine ROSneg diabetes    Renal/GU      Musculoskeletal negative musculoskeletal ROS (+)    Abdominal Normal abdominal exam  (+)   Peds  Hematology negative hematology ROS (+)   Anesthesia Other Findings   Reproductive/Obstetrics                              Anesthesia Physical Anesthesia Plan  ASA: 3  Anesthesia Plan: General   Post-op Pain Management: Tylenol  PO (pre-op)*   Induction: Intravenous  PONV Risk Score and Plan: Treatment may vary due to age or medical condition, Dexamethasone , Ondansetron , Midazolam , Propofol  infusion and TIVA  Airway Management Planned: Oral ETT  Additional Equipment: None  Intra-op Plan:   Post-operative Plan: Extubation in OR  Informed Consent: I have reviewed the patients History and Physical, chart, labs and discussed the procedure including the risks, benefits and alternatives for the proposed anesthesia with the patient or authorized representative who has indicated his/her understanding and acceptance.     Dental advisory given  Plan  Discussed with: CRNA and Surgeon  Anesthesia Plan Comments:          Anesthesia Quick Evaluation

## 2024-11-08 NOTE — Anesthesia Procedure Notes (Signed)
 Procedure Name: Intubation Date/Time: 11/08/2024 11:14 AM  Performed by: Norleen Alberta HERO., CRNAPre-anesthesia Checklist: Patient identified, Patient being monitored, Timeout performed, Emergency Drugs available and Suction available Patient Re-evaluated:Patient Re-evaluated prior to induction Oxygen Delivery Method: Circle system utilized Preoxygenation: Pre-oxygenation with 100% oxygen Induction Type: IV induction Ventilation: Mask ventilation without difficulty Laryngoscope Size: McGrath and 4 Grade View: Grade I Tube type: Oral Tube size: 8.5 mm Number of attempts: 1 Airway Equipment and Method: Stylet Placement Confirmation: ETT inserted through vocal cords under direct vision, positive ETCO2 and breath sounds checked- equal and bilateral Secured at: 21 cm Tube secured with: Tape Dental Injury: Teeth and Oropharynx as per pre-operative assessment

## 2024-11-08 NOTE — Transfer of Care (Signed)
 Immediate Anesthesia Transfer of Care Note  Patient: Daniel Long  Procedure(s) Performed: BRONCHOSCOPY, WITH EBUS (Bilateral)  Patient Location: PACU  Anesthesia Type:General  Level of Consciousness: awake, alert , and oriented  Airway & Oxygen Therapy: Patient Spontanous Breathing  Post-op Assessment: Report given to RN and Post -op Vital signs reviewed and stable  Post vital signs: stable  Last Vitals:  Vitals Value Taken Time  BP 112/69 11/08/24 12:06  Temp    Pulse 69 11/08/24 12:07  Resp 22 11/08/24 12:07  SpO2 94 % 11/08/24 12:07  Vitals shown include unfiled device data.  Last Pain:  Vitals:   11/08/24 1019  TempSrc: Temporal  PainSc: 0-No pain         Complications: There were no known notable events for this encounter.

## 2024-11-08 NOTE — Op Note (Signed)
 Bronchoscopy Procedure Note - EBUS  Daniel Long  [id number]  1956-08-31  Date:11/08/24  Time:12:06 PM   Provider Performing:Brylynn Hanssen   Procedure(s):  Flexible bronchoscopy with brushing (68376), Flexible bronchoscopy with bronchial alveolar lavage (68375), Endobronchial biopsy (68374), Transbronchial lung biopsy, single lobe (68371), and Transbronchial needle aspiration (68370), and EBUS  Indication(s) FDG avid right hilar lymph node  Consent Risks of the procedure as well as the alternatives and risks of each were explained to the patient and/or caregiver.  Consent for the procedure was obtained and is signed in the bedside chart  Anesthesia General Anesthesia   Time Out Verified patient identification, verified procedure, site/side was marked, verified correct patient position, special equipment/implants available, medications/allergies/relevant history reviewed, required imaging and test results available.   Sterile Technique Usual hand hygiene, masks, gowns, and gloves were used   Procedure Description Bronchoscope advanced through endotracheal tube and into airway.  Airways were examined down to subsegmental level with findings noted below.   Following diagnostic evaluation, BAL(s) performed in RUL with normal saline and return of Sanguinous fluid, Brushing(s) performed in RUL endobronchial lesion, Endobronchial biopsies performed in RUL endobronchilal lesion, and Transbronchial needle aspiration performed in RUL through the endobronchial lesion. EBUS TBNA performed at station 10R with a 21G olympus ViziShot 2 Needle.  Findings:   The flexible bronchoscope was introduced through the ET tube and the airway was examined to the segmental level.  The left tracheobronchial tree was noted to be within normal without any endobronchial lesions.  The right tracheobronchial tree was noted to have 2 takeoffs originate from the right mainstem bronchus and represent the right upper  lobe.  I suspected that if the more distal takeoff represents the anterior segment of the right upper lobe (RB 3) while in the more proximal takeoff represents the apical and posterior segments of the right upper lobe (right RB 1 and RB 2).  The bronchus intermedius, right middle lobe, and right lower lobe segmental bronchi were within normal without any endobronchial lesions.  In the distal takeoff (RB 3) of the right upper lobe there was an endobronchial lesion that was exophytic and friable.  This likely represented malignancy.  We proceeded to pretreat with topical lidocaine  with 2% epinephrine and ice cold saline before biopsying this endobronchial lesion with FNA transbronchial biopsy, brushings, and endobronchial forceps biopsies.  The samples were sent for slide, cytology, and pathology.  Following this, a BAL was performed in the right upper lobe RB 3 and sent for culture.  We then withdrew the flexible bronchoscope and introduced the endobronchial ultrasound (EBUS) and evaluated the hilar lymph node stations.  Given that the patient had an FDG avid right hilar lymph node that likely was in the 10R position based on imaging preop, I proceeded to evaluate the 10R position and noted the enlarged 10R lymph node posteriorly.  The lymph node was 1-1/2 to 2 cm in diameter with loss of central hilar structures that was suggestive of involvement by malignancy.  This was biopsied with a 21-gauge Olympus ViziShot 2 needle and multiple samples were sent for slide and cell block.  Endobronchial Lesion in RB3 of RUL      EBUS to station 10R    RUL (RB1 and RB2)    Bronchus intermedius    RML:    RLL    LUL:    LLL:    Complications/Tolerance None; patient tolerated the procedure well. Chest X-ray is needed post procedure.   EBL Minimal   Daniel Long  [name], MD [facility] 11/08/2024 12:21 PM

## 2024-11-08 NOTE — H&P (Signed)
 Assessment & Plan:   #AdenoCa of the Right Lung #AdenoCa of the Left Lung #FDG avid right hilar lymphadenopathy  The patient is here to undergo EBUS with TBNA of the right hilar lymph node station 10R that was FDG avid on his most recent PET/CT.  We discussed the importance of diagnosis and staging in lung malignancies, and the approach to obtaining a tissue diagnosis which would include endobronchial ultrasound guided sampling.  We also discussed the risks associated with the procedure which include a < 2% risk of pneumothorax, infection, bleeding, and nondiagnostic procedure in detail.   Patient is appropriate for the procedure and agrees to proceed.  Risks and benefits were explained and he consents.  All the questions were answered.   [name], MD [facility]  End of visit medications:  Meds ordered this encounter  Medications   lactated ringers  infusion   OR Linked Order Group    chlorhexidine  (PERIDEX ) 0.12 % solution 15 mL    Oral care mouth rinse     Current Facility-Administered Medications:    lactated ringers  infusion, , Intravenous, Continuous, Vicci Camellia Glatter, MD, Last Rate: 10 mL/hr at 11/08/24 1038, New Bag at 11/08/24 1038   Subjective:   PATIENT ID: Daniel Long GENDER: male DOB: 04-Jun-1956, MRN: [id number]  No chief complaint on file.   HPI  Patient is a 68 year old male with a history of smoking (53 pack years) who presented with an abnormal CT scan of the chest.  CT scan showed a right upper lobe pulmonary nodule as well as a left lower lobe faint pulmonary nodule.  Given smoking history and risk of malignancy, he underwent robotic assisted navigational bronchoscopy with my colleague Dr. Kara where FNA of both right upper lobe nodule and left lower lobe nodule showed adenocarcinoma that are likely 2 synchronous primaries.  Patient underwent EBUS with TBNA to stations 11R, 7, and 4L which were negative for malignancy.   Patient had a PET CT that showed FDG avidity in the right hilar lymph nodes (likely station 10R) for which oncology requested biopsy as that would change his staging.  He presents today for EBUS.  He has no symptoms he has not had any recent infections or respiratory illnesses  Ancillary information including prior medications, full medical/surgical/family/social histories, and PFTs (when available) are listed below and have been reviewed.    Review of Systems  Constitutional:  Negative for chills, fever and weight loss.  Respiratory:  Negative for cough, hemoptysis, sputum production, shortness of breath and wheezing.   Cardiovascular:  Negative for chest pain.     Objective:   Vitals:   11/08/24 1019  BP: (!) 136/90  Pulse: 66  Resp: 16  Temp: 97.8 F (36.6 C)  TempSrc: Temporal  SpO2: 96%   96% on RA BMI Readings from Last 3 Encounters:  11/07/24 25.84 kg/m  10/27/24 25.73 kg/m  10/27/24 25.73 kg/m   Wt Readings from Last 3 Encounters:  11/07/24 79.4 kg  10/27/24 79.7 kg  10/27/24 79.7 kg     Physical Exam Constitutional:      Appearance: Normal appearance. He is not ill-appearing.  HENT:     Head: Normocephalic and atraumatic.     Mouth/Throat:     Mouth: Mucous membranes are moist.  Cardiovascular:     Rate and Rhythm: Normal rate and regular rhythm.     Pulses: Normal pulses.     Heart sounds: Normal heart sounds.  Pulmonary:  Effort: Pulmonary effort is normal.     Breath sounds: Wheezing (faint expiratory wheezing) present.  Abdominal:     Palpations: Abdomen is soft.     Tenderness: There is no abdominal tenderness.  Neurological:     General: No focal deficit present.     Mental Status: He is alert and oriented to person, place, and time. Mental status is at baseline.       Ancillary Information    Past Medical History:  Diagnosis Date   Current smoker    History of kidney stones    Hyperlipidemia    Hypertension    Lung cancer  (HCC)    Lung nodule seen on imaging study    Malignant neoplasm of overlapping sites of lung (HCC)      Family History  Problem Relation Age of Onset   Heart disease Mother    Heart disease Father    Hypertension Father    Heart disease Brother      Past Surgical History:  Procedure Laterality Date   BRONCHIAL BIOPSY  10/06/2024   Procedure: BRONCHOSCOPY, WITH BIOPSY;  Surgeon: Kara Dorn NOVAK, MD;  Location: [facility] END[facility]COPY;  Service: Pulmonary;;   BRONCHIAL NEEDLE ASPIRATION BIOPSY  10/06/2024   Procedure: BRONCHOSCOPY, WITH NEEDLE ASPIRATION BIOPSY;  Surgeon: Kara Dorn NOVAK, MD;  Location: [facility] ENDOS[facility];  Service: Pulmonary;;   HAND SURGERY Left    LYMPH NODE BIOPSY     VIDEO BRONCHOSCOPY WITH ENDOBRONCHIAL NAVIGATION Right 10/06/2024   Procedure: VIDEO BRONCHOSCOPY WITH ENDOBRONCHIAL NAVIGATION;  Surgeon: Kara Dorn NOVAK, MD;  Location: [facility] ENDOSCOPY;  Service: Pulmonary;  Laterality: Right;   VIDEO BRONCHOSCOPY WITH ENDOBRONCHIAL ULTRASOUND N/A 10/06/2024   Procedure: BRONCHOSCOPY, WITH EBUS;  Surgeon: Kara Dorn NOVAK, MD;  Location: [facility] ENDOSCOPY;  Service: Pulmonary;  Laterality: N/A;    Social History   Socioeconomic History   Marital status: Divorced    Spouse name: Not on file   Number of children: 2   Years of education: GED   Highest education level: GED or equivalent  Occupational History   Occupation: RETIRED - SURVEYOR  Tobacco Use   Smoking status: Former    Current packs/day: 0.00    Average packs/day: 1 pack/day for 53.0 years (53.0 ttl pk-yrs)    Types: Cigarettes    Start date: 10/08/1971    Quit date: 10/13/2024    Years since quitting: 0.0   Smokeless tobacco: Never   Tobacco comments:    14-15 a day KRD 10/12/2024        Began smoking at 68 years old  Vaping Use   Vaping status: Never Used  Substance and Sexual Activity   Alcohol use: Not Currently   Drug use: Yes    Types: Marijuana    Comment: once in a while   Sexual activity:  Not Currently  Other Topics Concern   Not on file  Social History Narrative   Not on file   Social Drivers of Health   Financial Resource Strain: Not on file  Food Insecurity: No Food Insecurity (10/27/2024)   Hunger Vital Sign    Worried About Running Out of Food in the Last Year: Never true    Ran Out of Food in the Last Year: Never true  Transportation Needs: No Transportation Needs (10/27/2024)   PRAPARE - Administrator, Civil Service (Medical): No    Lack of Transportation (Non-Medical): No  Physical Activity: Not on file  Stress: No Stress Concern Present (10/27/2024)  [employer] of Occupational Health - Occupational Stress Questionnaire    Feeling of Stress: Not at all  Social Connections: Not on file  Intimate Partner Violence: Not At Risk (10/27/2024)   Humiliation, Afraid, Rape, and Kick questionnaire    Fear of Current or Ex-Partner: No    Emotionally Abused: No    Physically Abused: No    Sexually Abused: No     No Known Allergies   CBC    Component Value Date/Time   WBC 8.8 10/27/2024 1445   WBC 9.9 10/06/2024 0920   RBC 4.84 10/27/2024 1445   HGB 15.6 10/27/2024 1445   HCT 45.7 10/27/2024 1445   PLT 248 10/27/2024 1445   MCV 94.4 10/27/2024 1445   MCH 32.2 10/27/2024 1445   MCHC 34.1 10/27/2024 1445   RDW 12.7 10/27/2024 1445   LYMPHSABS 2.3 10/27/2024 1445   MONOABS 0.7 10/27/2024 1445   EOSABS 0.2 10/27/2024 1445   BASOSABS 0.0 10/27/2024 1445    Pulmonary Functions Testing Results:    Latest Ref Rng & Units 10/19/2024    1:36 PM  PFT Results  FVC-Pre L 3.16   FVC-Predicted Pre % 75   FVC-Post L 3.15   FVC-Predicted Post % 75   Pre FEV1/FVC % % 79   Post FEV1/FCV % % 78   FEV1-Pre L 2.49   FEV1-Predicted Pre % 80   FEV1-Post L 2.45   DLCO uncorrected ml/min/mmHg 27.52   DLCO UNC% % 111   DLCO corrected ml/min/mmHg 26.34   DLCO COR %Predicted % 106   DLVA Predicted % 121   TLC L 6.37   TLC % Predicted % 96   RV  % Predicted % 133     @ENCMEDSTART @

## 2024-11-09 ENCOUNTER — Encounter: Payer: Self-pay | Admitting: Student in an Organized Health Care Education/Training Program

## 2024-11-10 ENCOUNTER — Other Ambulatory Visit: Payer: Self-pay

## 2024-11-10 ENCOUNTER — Inpatient Hospital Stay: Payer: Self-pay | Admitting: Oncology

## 2024-11-10 LAB — CULTURE, RESPIRATORY W GRAM STAIN
Culture: NO GROWTH
Gram Stain: NONE SEEN

## 2024-11-10 LAB — ACID FAST SMEAR (AFB, MYCOBACTERIA): Acid Fast Smear: NEGATIVE

## 2024-11-10 NOTE — Anesthesia Postprocedure Evaluation (Signed)
 Anesthesia Post Note  Patient: Daniel Long  Procedure(s) Performed: BRONCHOSCOPY, WITH EBUS (Bilateral)  Patient location during evaluation: PACU Anesthesia Type: General Level of consciousness: awake and alert Pain management: pain level controlled Vital Signs Assessment: post-procedure vital signs reviewed and stable Respiratory status: spontaneous breathing, nonlabored ventilation, respiratory function stable and patient connected to nasal cannula oxygen Cardiovascular status: blood pressure returned to baseline and stable Postop Assessment: no apparent nausea or vomiting Anesthetic complications: no   There were no known notable events for this encounter.   Last Vitals:  Vitals:   11/08/24 1226 11/08/24 1240  BP: 115/83 123/76  Pulse: 73 (!) 58  Resp: (!) 22 18  Temp: (!) 36.3 C 36.6 C  SpO2: 93% 95%    Last Pain:  Vitals:   11/09/24 0922  TempSrc:   PainSc: 0-No pain                 Prentice Murphy

## 2024-11-10 NOTE — Progress Notes (Signed)
 The proposed treatment discussed in conference is for discussion purpose only and is not a binding recommendation.  The patients have not been physically examined, or presented with their treatment options.  Therefore, final treatment plans cannot be decided.

## 2024-11-11 ENCOUNTER — Ambulatory Visit: Payer: Self-pay | Admitting: Student in an Organized Health Care Education/Training Program

## 2024-11-11 LAB — SURGICAL PATHOLOGY

## 2024-11-11 LAB — CYTOLOGY - NON PAP

## 2024-11-16 NOTE — Progress Notes (Signed)
 [facility] at [facility] [address] [place],  [place]  [address] [phone]  Clinic Day:  11/17/2024  Referring physician: Benjamine [name] DASEN, NP   HISTORY OF PRESENT ILLNESS:  The patient is a 68 y.o. male who I was to begin seeing for lung adenocarcinoma.  He comes back in today to go over the results from his second bronchoscopy, which was done to rebiopsy a hypermetabolic perihilar lymph node seen on PET scan.  Of note, with the second bronchoscopy, an endobronchial lesion was seen in one of the bronchi going to his right upper lobe.  This lesion was also biopsied to determine if it was malignant in nature.  Overall, the patient continues to do fairly well.  He denies having any significant respiratory symptoms which concern him for overt signs of disease progression.  PHYSICAL EXAM:  Blood pressure (!) 124/56, pulse 79, temperature 98.3 F (36.8 C), temperature source Oral, resp. rate 16, height 5' 9.29 (1.76 m), weight 176 lb 14.4 oz (80.2 kg), SpO2 95%. Wt Readings from Last 3 Encounters:  11/17/24 176 lb 14.4 oz (80.2 kg)  11/07/24 175 lb (79.4 kg)  10/27/24 175 lb 11.2 oz (79.7 kg)   Body mass index is 25.91 kg/m. Performance status (ECOG): 0 - Asymptomatic Physical Exam Constitutional:      Appearance: Normal appearance. He is not ill-appearing.  HENT:     Mouth/Throat:     Mouth: Mucous membranes are moist.     Pharynx: Oropharynx is clear. No oropharyngeal exudate or posterior oropharyngeal erythema.  Cardiovascular:     Rate and Rhythm: Normal rate and regular rhythm.     Heart sounds: No murmur heard.    No friction rub. No gallop.  Pulmonary:     Effort: Pulmonary effort is normal. No respiratory distress.     Breath sounds: Normal breath sounds. No wheezing, rhonchi or rales.  Abdominal:     General: Bowel sounds are normal. There is no distension.     Palpations: Abdomen is soft. There is no mass.     Tenderness: There is no abdominal  tenderness.  Musculoskeletal:        General: No swelling.     Right lower leg: No edema.     Left lower leg: No edema.  Lymphadenopathy:     Cervical: No cervical adenopathy.     Upper Body:     Right upper body: No supraclavicular or axillary adenopathy.     Left upper body: No supraclavicular or axillary adenopathy.     Lower Body: No right inguinal adenopathy. No left inguinal adenopathy.  Skin:    General: Skin is warm.     Coloration: Skin is not jaundiced.     Findings: No lesion or rash.  Neurological:     General: No focal deficit present.     Mental Status: He is alert and oriented to person, place, and time. Mental status is at baseline.  Psychiatric:        Mood and Affect: Mood normal.        Behavior: Behavior normal.        Thought Content: Thought content normal.    PATHOLOGY: His 2nd bronchoscopy/EBUS with biopsy on 11-08-24 revealed the following:  Lung, biopsy, RUL endobronchial lesion :      NON-SMALL CELL CARCINOMA, FAVOR ADENOCARCINOMA.      SEE NOTE.       Diagnosis Note : The specimen contains limited pulmonary parenchyma with limites  atypical large cells with rare mitosis. Immunohistochemical stains including      TTF-1, p40, synaptophysin, chromogranin A, Ki67 were attempted to characterize      the cells. However the stains are not-contributory due to the insufficiency of      the tumor cells. Please correlate the concurrent cytology specimen.      Controls worked appropriately.      The case was peer-reviewed by Dr. Reed who agrees with the diagnosis.   FINAL DIAGNOSIS  STATEMENT Of SPECIMEN ADEQUACY:   INTERPRETATION(S):      NON-SMALL CELL CARCINOMA, CONSISTENT WITH ADENOCARCINOMA.       NON-SMALL CELL CARCINOMA, CONSISTENT WITH ADENOCARCINOMA.       NON-SMALL CELL CARCINOMA, CONSISTENT WITH ADENOCARCINOMA.       LYMPHOCYTES PRESENT.   Diagnosis Note : Immunohistochemical stains for TTF-1 and p40 were performed on  part 1 and  part3.The stain for TTF-1 highlights the malignant cells supporting  the diagnosis of adenocarcinoma.  ----------------------------------------------------------------------------------------------------------- FINAL MICROSCOPIC DIAGNOSIS:  A. LUNG, RUL, FINE NEEDLE ASPIRATION  BIOPSIES:  - Adenocarcinoma  - See comment.   COMMENT:  The specimen shows scant atypical epithelioid cells. Immunohistochemical  stains show the cells are positive for TTF-1 and are negative for p40.  The findings are consistent with adenocarcinoma.   B. LUNG, LLL TARGET #2, FINE NEEDLE ASPIRATION  BIOPSIES:  - Adenocarcinoma  - See comment   C. LUNG, LLL TARGET #2, BRUSHING:  - Rare atypical cells present  - Predominantly blood   LABS:      Latest Ref Rng & Units 10/27/2024    2:45 PM 10/06/2024    9:20 AM  CBC  WBC 4.0 - 10.5 K/uL 8.8  9.9   Hemoglobin 13.0 - 17.0 g/dL 84.3  83.6   Hematocrit 39.0 - 52.0 % 45.7  47.0   Platelets 150 - 400 K/uL 248  252       Latest Ref Rng & Units 10/27/2024    2:45 PM 10/06/2024    9:20 AM  CMP  Glucose 70 - 99 mg/dL 883  889   BUN 8 - 23 mg/dL 14  11   Creatinine 9.38 - 1.24 mg/dL 9.36  9.37   Sodium 864 - 145 mmol/L 141  139   Potassium 3.5 - 5.1 mmol/L 4.1  3.9   Chloride 98 - 111 mmol/L 105  107   CO2 22 - 32 mmol/L 28  23   Calcium 8.9 - 10.3 mg/dL 9.0  8.7   Total Protein 6.5 - 8.1 g/dL 6.9    Total Bilirubin 0.0 - 1.2 mg/dL 0.3    Alkaline Phos 38 - 126 U/L 95    AST 15 - 41 U/L 33    ALT 0 - 44 U/L 53      STUDIES:  DG Chest Port 1 View Result Date: 11/08/2024 EXAM: 1 VIEW(S) XRAY OF THE CHEST 11/08/2024 12:17:00 PM COMPARISON: 10/06/2024 CLINICAL HISTORY: S/P bronchoscopy [id number] FINDINGS: LUNGS AND PLEURA: Right upper lobe pulmonary nodule again noted. No pleural effusion. No pneumothorax. HEART AND MEDIASTINUM: Atheromatous vascular calcification of the aortic arch. BONES AND SOFT TISSUES: No acute osseous abnormality. IMPRESSION: 1. Right  upper lobe pulmonary nodule, stable compared to 10/06/2024. 2. Aortic arch atheromatous vascular calcification. Electronically signed by: Ryan Salvage MD 11/08/2024 12:51 PM EST RP Workstation: [id number]   MR BRAIN W WO CONTRAST Result Date: 10/23/2024 EXAM: MRI BRAIN WITH AND WITHOUT CONTRAST 10/20/2024 11:50:40 AM TECHNIQUE: Multiplanar multisequence MRI of the head/brain  was performed with and without the administration of intravenous contrast. CONTRAST: 8 mL of Gadavist . COMPARISON: None available. CLINICAL HISTORY: Non-small cell lung cancer. 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 No acute intracranial hemorrhage. No mass effect or midline shift. No hydrocephalus. The sella is unremarkable. Normal flow voids. No mass or abnormal enhancement. ORBITS: No acute abnormality. SINUSES: No acute abnormality. BONES AND SOFT TISSUES: Normal bone marrow signal and enhancement. No acute soft tissue abnormality. IMPRESSION: 1. No acute intracranial abnormality. No evidence for metastatic disease to the brain. 2. 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  As mentioned previously, both an endobronchial lesion  and a right perihilar lymph node were both positive for carcinoma.  This, in conjunction with his right upper lobe lesion that was also biopsy-proven to be adenocarcinoma, qualifies him as having stage IIA (T1b N1 M0) adenocarcinoma in his right upper lobe.  He also appears to have a very early stage I adenocarcinoma in his left lower lobe.  In clinic today, I talked to the patient about his biopsy results and how they impact his future treatment.  As there is nodal involvement in his right  hilar region, the patient will receive concurrent chemoradiation to treat his right upper lobe and endobronchial lesions.  The chemotherapy will consist of weekly carboplatin/paclitaxel.  He was made aware of the side effects which can go along with his chemotherapy regimen, including nausea, peripheral neuropathy, cytopenias, and alopecia.  I will arrange for him to have a port placed, through which all of his future IV chemotherapy will be given.  Radiation oncology will also see him to formulate his concurrent radiation treatment plan.  With respect to his very small left lower lobe adenocarcinoma, I do believe this lesion would be best addressed  with stereotactic radiation.  Radiation oncology will also see him to formulate this plan.  I anticipate this gentleman's chemoradiation to commence within the next 2-3 weeks.  I will tentatively see him back in 1 month to reassess him as he is going through his chemoradiation.  The patient understands all the plans discussed today and is in agreement with them.   Daniel Long DELENA Kerns, MD

## 2024-11-17 ENCOUNTER — Inpatient Hospital Stay: Attending: Oncology | Admitting: Oncology

## 2024-11-17 VITALS — BP 124/56 | HR 79 | Temp 98.3°F | Resp 16 | Ht 69.29 in | Wt 176.9 lb

## 2024-11-17 DIAGNOSIS — C3481 Malignant neoplasm of overlapping sites of right bronchus and lung: Secondary | ICD-10-CM | POA: Diagnosis not present

## 2024-11-17 DIAGNOSIS — Z9221 Personal history of antineoplastic chemotherapy: Secondary | ICD-10-CM | POA: Insufficient documentation

## 2024-11-17 DIAGNOSIS — C3411 Malignant neoplasm of upper lobe, right bronchus or lung: Secondary | ICD-10-CM | POA: Diagnosis present

## 2024-11-17 DIAGNOSIS — Z923 Personal history of irradiation: Secondary | ICD-10-CM | POA: Insufficient documentation

## 2024-11-25 ENCOUNTER — Ambulatory Visit
Admission: RE | Admit: 2024-11-25 | Discharge: 2024-11-25 | Disposition: A | Discharge status: Home / Self Care | Source: Ambulatory Visit | Attending: Radiation Oncology | Admitting: Radiation Oncology

## 2024-11-25 ENCOUNTER — Ambulatory Visit: Admitting: Radiation Oncology

## 2024-11-25 DIAGNOSIS — C3411 Malignant neoplasm of upper lobe, right bronchus or lung: Secondary | ICD-10-CM | POA: Insufficient documentation

## 2024-11-28 NOTE — Progress Notes (Signed)
 CC: Bilateral lung cancer, needs port  HPI:  Daniel Long 68 y.o. male who states he had a routine screening test for lung cancer was found to have bilateral lung masses.  These have been shown to be malignant neoplasms of both lungs.  Patient seen medical oncology and discussed with radiation oncology.  Plans are being made for chemoradiation therapy.  I have been asked to place a port this patient.  Patient is trying to quit smoking.  He presents to the office for surgical consultation.   Medical History[1]  Surgical History[2]  Family History[3]  Social Connections: Not on file   Social History   Socioeconomic History   Marital status: Divorced    Spouse name: Not on file   Number of children: Not on file   Years of education: Not on file   Highest education level: Not on file  Occupational History   Not on file  Tobacco Use   Smoking status: Every Day   Smokeless tobacco: Current  Substance and Sexual Activity   Alcohol use: Yes   Drug use: No   Sexual activity: Not on file  Other Topics Concern   Not on file  Social History Narrative   Not on file   Social Drivers of Health   Living Situation: Low Risk (11/28/2024)   Living Situation    What is your living situation today?: I have a steady place to live    Think about the place you live. Do you have problems with any of the following? Choose all that apply:: None/None on this list  Food Insecurity: Low Risk (11/28/2024)   Food vital sign    Within the past 12 months, you worried that your food would run out before you got money to buy more: Never true    Within the past 12 months, the food you bought just didn't last and you didn't have money to get more: Never true  Transportation Needs: No Transportation Needs (11/28/2024)   Transportation    In the past 12 months, has lack of reliable transportation kept you from medical appointments, meetings, work or from getting things needed for daily  living? : No  Utilities: Low Risk (11/28/2024)   Utilities    In the past 12 months has the electric, gas, oil, or water company threatened to shut off services in your home? : No  Safety: Low Risk (11/28/2024)   Safety    How often does anyone, including family and friends, physically hurt you?: Never    How often does anyone, including family and friends, insult or talk down to you?: Never    How often does anyone, including family and friends, threaten you with harm?: Never    How often does anyone, including family and friends, scream or curse at you?: Never  Alcohol Screening: Not on file  Tobacco Use: High Risk (11/28/2024)   Patient History    Smoking Tobacco Use: Every Day    Smokeless Tobacco Use: Current    Passive Exposure: Not on file  Depression: Not on file  Social Connections: Not on file  Financial Resource Strain: Not on file    Medications ordered prior to the current encounter[4]  Allergies[5]      ROS:  Complete system ROS was verbally obtained and reviewed today and is negative with the exception of the pertinent positives and negatives listed in the HPI.    BP 110/70   Pulse 84   Temp 98.2 F (36.8 C) (Temporal)  Resp 14   Ht 1.753 m (5' 9)   Wt 79.8 kg (176 lb)   SpO2 97%   BMI 25.99 kg/m   General:  Normal male no distress HEENT:  Normocephalic, atraumatic Neck:  Full range of motion, no tenderness Chest:  Equal expansion bilaterally Lungs:  Clear to auscultation without wheezes or rhonchi Cardiac: regular rate and rhythm without murmur Abdomen:  Soft, nontender, active bowel sounds Extremities: normal gait Skin: no jaundice Neurologic: no focal deficits noted   Assessment: Bilateral lung cancer  PLAN:  Plan venous access port placement. The rationale for this was discussed.  Nonsurgical option was discussed.  Risks of surgery were discussed and include, but are not limited to:  Bleeding, infection, nerve damage, cardiopulmonary  risks, port malfunction, port infection, arterial injury, pneumothorax, or other possible adverse outcomes. Treatment plan was discussed in detail.  Patient is agreeable to venous access port placement.    I have personally spent 31 minutes involved in face-to-face and non-face-to-face activities for this patient on the day of the visit.  Professional time spent includes the following activities, in addition to those noted in the documentation: Review of medical oncology notes, review of radiation collagen notes, review of pulmonology notes, review of bronchoscopy, review of primary care provider notes, independent review of imaging studies.       [1] Past Medical History: Diagnosis Date   Hypertension    Laceration of left hand 09/21/2015  [2] Past Surgical History: Procedure Laterality Date   HAND SURGERY Left 09/24/2015   Procedure: HAND SURGERY  [3] Family History Problem Relation Name Age of Onset   Heart disease Mother     Heart disease Father     Hypertension Father    [4] Current Outpatient Medications on File Prior to Visit  Medication Sig   amlodipine-valsartan 10-320 mg tab TAKE 1/2 TABLET BY MOUTH DAILY FOR hypertension   aspirin 81 mg tab Take 81 mg by mouth daily.   atorvastatin (LIPITOR) 20 mg tablet TAKE 1 TABLET BY MOUTH AT BEDTIME FOR CHOLESTEROL.   fluticasone propionate (FLONASE) 50 mcg/spray nasal spray Administer 1-2 sprays into affected nostril(s) daily as needed for allergies. as needed   levocetirizine (XYZAL) 5 mg tablet Take 5 mg by mouth daily as needed.   aspirin-calcium carbonate (Women's Aspirin with Calcium) 81 mg-300 mg calcium(777 mg) tab Take 81 mg by mouth. (Patient not taking: Reported on 11/28/2024)   diclofenac (VOLTAREN) 50 mg EC tablet Take 50 mg by mouth 2 (two) times a day. (Patient not taking: Reported on 11/28/2024)   No current facility-administered medications on file prior to visit.  [5] No Known Allergies

## 2024-12-05 DIAGNOSIS — C3411 Malignant neoplasm of upper lobe, right bronchus or lung: Secondary | ICD-10-CM | POA: Diagnosis not present

## 2024-12-09 LAB — FUNGUS CULTURE RESULT

## 2024-12-09 LAB — FUNGUS CULTURE WITH STAIN

## 2024-12-09 LAB — FUNGAL ORGANISM REFLEX

## 2024-12-12 ENCOUNTER — Ambulatory Visit: Admitting: Radiation Oncology

## 2024-12-12 ENCOUNTER — Ambulatory Visit: Admission: RE | Admit: 2024-12-12 | Source: Ambulatory Visit | Admitting: Radiation Oncology

## 2024-12-12 DIAGNOSIS — C3481 Malignant neoplasm of overlapping sites of right bronchus and lung: Secondary | ICD-10-CM

## 2024-12-12 NOTE — Progress Notes (Signed)
 START ON PATHWAY REGIMEN - Non-Small Cell Lung     A cycle is every 7 days, concurrent with RT:     Paclitaxel      Carboplatin   **Always confirm dose/schedule in your pharmacy ordering system**  Patient Characteristics: Preoperative or Nonsurgical Candidate (Clinical Staging), Stage IIA or Stage IIB (N0-1 only), Nonsurgical Candidate Therapeutic Status: Preoperative or Nonsurgical Candidate (Clinical Staging) Check here if patient was staged using an edition other than AJCC Staging 9th Edition: false AJCC T Category: cT1b AJCC N Category: cN1 AJCC M Category: cM0 AJCC 9 Stage Grouping: IIA Intent of Therapy: Curative Intent, Discussed with Patient

## 2024-12-13 ENCOUNTER — Encounter: Payer: Self-pay | Admitting: Oncology

## 2024-12-13 ENCOUNTER — Ambulatory Visit: Admitting: Radiation Oncology

## 2024-12-13 ENCOUNTER — Encounter: Payer: Self-pay | Admitting: Hematology and Oncology

## 2024-12-13 ENCOUNTER — Other Ambulatory Visit: Payer: Self-pay

## 2024-12-13 ENCOUNTER — Ambulatory Visit

## 2024-12-13 ENCOUNTER — Inpatient Hospital Stay: Attending: Hematology and Oncology | Admitting: Hematology and Oncology

## 2024-12-13 VITALS — BP 135/79 | HR 73 | Temp 98.1°F | Resp 18 | Ht 69.29 in | Wt 175.6 lb

## 2024-12-13 DIAGNOSIS — C3411 Malignant neoplasm of upper lobe, right bronchus or lung: Secondary | ICD-10-CM | POA: Insufficient documentation

## 2024-12-13 DIAGNOSIS — Z5111 Encounter for antineoplastic chemotherapy: Secondary | ICD-10-CM | POA: Insufficient documentation

## 2024-12-13 DIAGNOSIS — C3432 Malignant neoplasm of lower lobe, left bronchus or lung: Secondary | ICD-10-CM | POA: Diagnosis not present

## 2024-12-13 DIAGNOSIS — Z79899 Other long term (current) drug therapy: Secondary | ICD-10-CM | POA: Insufficient documentation

## 2024-12-13 DIAGNOSIS — C3481 Malignant neoplasm of overlapping sites of right bronchus and lung: Secondary | ICD-10-CM

## 2024-12-13 MED ORDER — LIDOCAINE-PRILOCAINE 2.5-2.5 % EX CREA: TOPICAL_CREAM | CUTANEOUS | 3 refills | Status: AC

## 2024-12-13 MED ORDER — PROCHLORPERAZINE MALEATE 10 MG PO TABS: 10.0000 mg | ORAL_TABLET | Freq: Four times a day (QID) | ORAL | 1 refills | Status: AC | PRN

## 2024-12-13 MED ORDER — ONDANSETRON HCL 8 MG PO TABS: 8.0000 mg | ORAL_TABLET | Freq: Three times a day (TID) | ORAL | 1 refills | Status: DC | PRN

## 2024-12-13 MED ORDER — DEXAMETHASONE 4 MG PO TABS: ORAL_TABLET | ORAL | 1 refills | Status: AC

## 2024-12-13 NOTE — Progress Notes (Signed)
 "  [facility] CARE CLINIC CONSULT NOTE [facility]  [phone] [phone]  Patient Care Team: Benjamine [name] DASEN, NP as PCP - General (Nurse Practitioner)   Name of the patient: Daniel Long  [id number]  [birth year]   Date of visit: 12/13/2024  Diagnosis- Lung Cancer  Chief complaint/Reason for visit- Initial Meeting for [facility], preparing for starting chemotherapy   Heme/Onc history:  Oncology History  Malignant neoplasm of overlapping sites of lung (HCC)  10/31/2024 Initial Diagnosis   Malignant neoplasm of overlapping sites of lung (HCC)   11/06/2024 Cancer Staging   Staging form: Lung, AJCC V9 - Clinical: Stage IIA (cT1b, cN1, cM0) - Signed by Ezzard Valaria LABOR, MD on 11/06/2024   12/16/2024 -  Chemotherapy   Patient is on Treatment Plan : LUNG Carboplatin + Paclitaxel + XRT q7d       Interval history-  Patient presents to chemo care clinic today for initial meeting in preparation for starting chemotherapy. I introduced the chemo care clinic and we discussed that the role of the clinic is to assist those who are at an increased risk of emergency room visits and/or complications during the course of chemotherapy treatment. We discussed that the increased risk takes into account factors such as age, performance status, and co-morbidities. We also discussed that for some, this might include barriers to care such as not having a primary care provider, lack of insurance/transportation, or not being able to afford medications. We discussed that the goal of the program is to help prevent unplanned ER visits and help reduce complications during chemotherapy. We do this by discussing specific risk factors to each individual and identifying ways that we can help improve these risk factors and reduce barriers to care.   Allergies[1]  Past Medical History:  Diagnosis Date   Current smoker    History of kidney stones    Hyperlipidemia     Hypertension    Lung cancer (HCC)    Lung nodule seen on imaging study    Malignant neoplasm of overlapping sites of lung [facility])     Past Surgical History:  Procedure Laterality Date   BRONCHIAL BIOPSY  10/06/2024   Procedure: BRONCHOSCOPY, WITH BIOPSY;  Surgeon: Kara Dorn NOVAK, MD;  Location: [facility] ENDOSCOPY;  Service: Pulmonary;;   BRONCHIAL NEEDLE ASPIRATION BIOPSY  10/06/2024   Procedure: BRONCHOSCOPY, WITH NEEDLE ASPIRATION BIOPSY;  Surgeon: Kara Dorn NOVAK, MD;  Location: [facility] ENDOSCOPY;  Service: Pulmonary;;   HAND SURGERY Left    LYMPH NODE BIOPSY     VIDEO BRONCHOSCOPY WITH ENDOBRONCHIAL NAVIGATION Right 10/06/2024   Procedure: VIDEO BRONCHOSCOPY WITH ENDOBRONCHIAL NAVIGATION;  Surgeon: Kara Dorn NOVAK, MD;  Location: [facility] ENDOSCOPY;  Service: Pulmonary;  Laterality: Right;   VIDEO BRONCHOSCOPY WITH ENDOBRONCHIAL ULTRASOUND N/A 10/06/2024   Procedure: BRONCHOSCOPY, WITH EBUS;  Surgeon: Kara Dorn NOVAK, MD;  Location: [facility] ENDOSCOPY;  Service: Pulmonary;  Laterality: N/A;   VIDEO BRONCHOSCOPY WITH ENDOBRONCHIAL ULTRASOUND Bilateral 11/08/2024   Procedure: BRONCHOSCOPY, WITH EBUS;  Surgeon: [name] Hose, MD;  Location: [facility] ORS;  Service: Pulmonary;  Laterality: Bilateral;    Social History   Socioeconomic History   Marital status: Divorced    Spouse name: Not on file   Number of children: 2   Years of education: GED   Highest education level: GED or equivalent  Occupational History   Occupation: RETIRED - SURVEYOR  Tobacco Use   Smoking status: Former    Current packs/day: 0.00    Average packs/day:  1 pack/day for 53.0 years (53.0 ttl pk-yrs)    Types: Cigarettes    Start date: 10/08/1971    Quit date: 10/13/2024    Years since quitting: 0.1   Smokeless tobacco: Never   Tobacco comments:    14-15 a day KRD 10/12/2024        Began smoking at 69 years old  Vaping Use   Vaping status: Never Used  Substance and Sexual Activity   Alcohol use: Not Currently   Drug  use: Yes    Types: Marijuana    Comment: once in a while   Sexual activity: Not Currently  Other Topics Concern   Not on file  Social History Narrative   Not on file   Social Drivers of Health   Tobacco Use: High Risk (12/13/2024)   Received from [facility]   Patient History    Smoking Tobacco Use: Every Day    Smokeless Tobacco Use: Current    Passive Exposure: Not on file  Financial Resource Strain: Not on file  Food Insecurity: Low Risk (12/13/2024)   Received from [facility]   Epic    Within the past 12 months, you worried that your food would run out before you got money to buy more: Never true    Within the past 12 months, the food you bought just didn't last and you didn't have money to get more. : Never true  Transportation Needs: No Transportation Needs (12/13/2024)   Received from [facility]    In the past 12 months, has lack of reliable transportation kept you from medical appointments, meetings, work or from getting things needed for daily living? : No  Physical Activity: Not on file  Stress: No Stress Concern Present (10/27/2024)   [employer] of Occupational Health - Occupational Stress Questionnaire    Feeling of Stress: Not at all  Social Connections: Not on file  Intimate Partner Violence: Not At Risk (10/27/2024)   Epic    Fear of Current or Ex-Partner: No    Emotionally Abused: No    Physically Abused: No    Sexually Abused: No  Depression (PHQ2-9): Low Risk (10/27/2024)   Depression (PHQ2-9)    PHQ-2 Score: 0  Alcohol Screen: Low Risk (10/27/2024)   Alcohol Screen    Last Alcohol Screening Score (AUDIT): 0  Housing: Low Risk (12/13/2024)   Received from [facility]   Epic    What is your living situation today?: I have a steady place to live    Think about the place you live. Do you have problems with any of the following? Choose all that apply:: None/None on this list  Utilities: Low Risk (12/13/2024)   Received from  [facility]   Utilities    In the past 12 months has the electric, gas, oil, or water company threatened to shut off services in your home? : No  Health Literacy: Not on file    Family History  Problem Relation Age of Onset   Heart disease Mother    Heart disease Father    Hypertension Father    Heart disease Brother     Current Medications[2]     Latest Ref Rng & Units 10/27/2024    2:45 PM  CMP  Glucose 70 - 99 mg/dL 883   BUN 8 - 23 mg/dL 14   Creatinine 9.38 - 1.24 mg/dL 9.36   Sodium 864 - 854 mmol/L 141   Potassium 3.5 - 5.1 mmol/L 4.1  Chloride 98 - 111 mmol/L 105   CO2 22 - 32 mmol/L 28   Calcium 8.9 - 10.3 mg/dL 9.0   Total Protein 6.5 - 8.1 g/dL 6.9   Total Bilirubin 0.0 - 1.2 mg/dL 0.3   Alkaline Phos 38 - 126 U/L 95   AST 15 - 41 U/L 33   ALT 0 - 44 U/L 53       Latest Ref Rng & Units 10/27/2024    2:45 PM  CBC  WBC 4.0 - 10.5 K/uL 8.8   Hemoglobin 13.0 - 17.0 g/dL 84.3   Hematocrit 60.9 - 52.0 % 45.7   Platelets 150 - 400 K/uL 248     No images are attached to the encounter.  No results found.   Assessment and plan- Patient is a 69 y.o. male who presents to Chemo Care Clinic for initial meeting in preparation for starting chemotherapy for the treatment of lung cancer.   Chemo Care Clinic/High Risk for ER/Hospitalization during chemotherapy- We discussed the role of the chemo care clinic and identified patient specific risk factors. I discussed that patient was identified as high risk primarily based on:  Patient has past medical history positive for: Past Medical History:  Diagnosis Date   Current smoker    History of kidney stones    Hyperlipidemia    Hypertension    Lung cancer (HCC)    Lung nodule seen on imaging study    Malignant neoplasm of overlapping sites of lung [facility])     Patient has past surgical history positive for: Past Surgical History:  Procedure Laterality Date   BRONCHIAL BIOPSY  10/06/2024   Procedure:  BRONCHOSCOPY, WITH BIOPSY;  Surgeon: Kara Dorn NOVAK, MD;  Location: [facility] ENDOSCOPY;  Service: Pulmonary;;   BRONCHIAL NEEDLE ASPIRATION BIOPSY  10/06/2024   Procedure: BRONCHOSCOPY, WITH NEEDLE ASPIRATION BIOPSY;  Surgeon: Kara Dorn NOVAK, MD;  Location: [facility] ENDOSCOPY;  Service: Pulmonary;;   HAND SURGERY Left    LYMPH NODE BIOPSY     VIDEO BRONCHOSCOPY WITH ENDOBRONCHIAL NAVIGATION Right 10/06/2024   Procedure: VIDEO BRONCHOSCOPY WITH ENDOBRONCHIAL NAVIGATION;  Surgeon: Kara Dorn NOVAK, MD;  Location: [facility] ENDOSCOPY;  Service: Pulmonary;  Laterality: Right;   VIDEO BRONCHOSCOPY WITH ENDOBRONCHIAL ULTRASOUND N/A 10/06/2024   Procedure: BRONCHOSCOPY, WITH EBUS;  Surgeon: Kara Dorn NOVAK, MD;  Location: [facility] ENDOSCOPY;  Service: Pulmonary;  Laterality: N/A;   VIDEO BRONCHOSCOPY WITH ENDOBRONCHIAL ULTRASOUND Bilateral 11/08/2024   Procedure: BRONCHOSCOPY, WITH EBUS;  Surgeon: [name] Hose, MD;  Location: [facility] ORS;  Service: Pulmonary;  Laterality: Bilateral;     We discussed that social determinants of health may have significant impacts on health and outcomes for cancer patients.  Today we discussed specific social determinants of performance status, alcohol use, depression, financial needs, food insecurity, housing, interpersonal violence, social connections, stress, tobacco use, and transportation.    After lengthy discussion the following were identified as areas of need:   Outpatient services: We discussed options including home based and outpatient services, DME and care program. We discusssed that patients who participate in regular physical activity report fewer negative impacts of cancer and treatments and report less fatigue.   Financial Concerns: We discussed that living with cancer can create tremendous financial burden.  We discussed options for assistance. I asked that if assistance is needed in affording medications or paying bills to please let us  know so that we can provide  assistance. We discussed options for food including social services,  and onsite food pantry.  We  will also notify Lizbeth Sprague to see if cancer center can provide additional support.  Referral to Social work: Introduced child psychotherapist [employer] and the services she can provide such as support with utility bill, cell phone and gas vouchers.   Support groups: We discussed options for support groups at the cancer center. If interested, please notify nurse navigator to enroll. We discussed options for managing stress including healthy eating, exercise as well as participating in no charge counseling services at the cancer center and support groups.  If these are of interest, patient can notify either myself or primary nursing team.We discussed options for management including medications and referral to quit Smart program  Transportation: We discussed options for transportation including [employer], paratransit, bus routes, link transit, taxi/uber/lyft, and cancer center van.  I have notified primary oncology team who will help assist with arranging fleeta transportation for appointments when/if needed. We also discussed options for transportation on short notice/acute visits.  Palliative care services: We have palliative care services available in the cancer center to discuss goals of care and advanced care planning.  Please let us  know if you have any questions or would like to speak to our palliative nurse practitioner.  Symptom Management Clinic: We discussed our symptom management clinic which is available for acute concerns while receiving treatment such as nausea, vomiting or diarrhea.  We can be reached via telephone at [phone] or through my chart.  We are available for virtual or in person visits on the same day from 830 to 4 PM Monday through Friday. He denies needing specific assistance at this time and He will be followed by Dr. Valaria Kerns clinical team.  Plan: Discussed symptom management  clinic. Discussed palliative care services. Discussed resources that are available here at the cancer center. Discussed medications and new prescriptions to begin treatment such as anti-nausea or steroids.  The patient is a with newly diagnosed.  Patient presents to clinic today for chemotherapy education and palliative care consult.  We will start.  We will send in prescriptions for prochlorperazine  and ondansetron .  The patient verbalizes understanding of and agreement to the plan as discussed today.  Provided general information including the following: 1.  Date of education: 12/13/2024 2.  Physician name: Valaria Kerns, MD 3.  Diagnosis: Lung Cancer 4.  Stage: Stage IIA 5.  Curative 6.  Chemotherapy plan including drugs and how often: Weekly paclitaxel/ carboplatin 7.  Start date: Pending 8.  Other referrals: Social Work 9.  The patient is to call our office with any questions or concerns.  Our office number [phone], if after hours or on the weekend, call the same number and wait for the answering service.  There is always an oncologist on call 10.  Medications prescribed: Ondansetron , Prochlorperazine  11.  The patient has verbalized understanding of the treatment plan and has no barriers to adherence or understanding.  Obtained signed consent from patient.  Discussed symptoms including 1.  Low blood counts including red blood cells, white blood cells and platelets. 2. Infection including to avoid large crowds, wash hands frequently, and stay away from people who were sick.  If fever develops of 100.4 or higher, call our office. 3.  Mucositis-given instructions on mouth rinse (baking soda and salt mixture).  Keep mouth clean.  Use soft bristle toothbrush.  If mouth sores develop, call our clinic. 4.  Nausea/vomiting-gave prescriptions for ondansetron  4 mg every 4 hours as needed for nausea, may take around the clock if persistent.  Compazine   10 mg every 6 hours, may take around the  clock if persistent. 5.  Diarrhea-use over-the-counter Imodium.  Call clinic if not controlled. 6.  Constipation-use senna, 1 to 2 tablets twice a day.  If no BM in 2 to 3 days call the clinic. 7.  Loss of appetite-try to eat small meals every 2-3 hours.  Call clinic if not eating. 8.  Taste changes-zinc 500 mg daily.  If becomes severe call clinic. 9.  Alcoholic beverages. 10.  Drink 2 to 3 quarts of water per day. 11.  Peripheral neuropathy-patient to call if numbness or tingling in hands or feet is persistent  Neulasta-will be given 24 to 48 hours after chemotherapy.  Gave information sheet on bone and joint pain.  Use Claritin or Pepcid.  May use ibuprofen or Aleve.  Call if symptoms persist or are unbearable.  Gave information on the supportive care team and how to contact them regarding services.  Discussed advanced directives.  The patient does not have their advanced directives but will look at the copy provided in their notebook and will call with any questions. [employer] Social worker Advanced directives  Answered questions to patient satisfaction.  Patient is to call with any further questions or concerns.  Time spent on this palliative care/chemotherapy education was 60 minutes with more than 50% spent discussing diagnosis, prognosis and symptom management.  The medication prescribed to the patient will be printed out from [email or web address] This will give the following information: Name of your medication Approved uses Dose and schedule Storage and handling Handling body fluids and waste Drug and food interactions Possible side effects and management Pregnancy, sexual activity, and contraception Obtaining medication   Disposition: RTC on   Visit Diagnosis No diagnosis found.  Patient expressed understanding and was in agreement with this plan. He also understands that He can call clinic at any time with any questions, concerns, or complaints.    I provided 60 minutes of face to face during this encounter, and > 50% was spent counseling as documented under my assessment & plan.   Eleanor Bach, FNP- [facility] at [facility] [phone]     [1] No Known Allergies [2]  Current Outpatient Medications:    amLODipine-valsartan (EXFORGE) 10-320 MG tablet, Take 1 tablet by mouth daily., Disp: , Rfl:    aspirin 81 MG tablet, Take 81 mg by mouth daily., Disp: , Rfl:    atorvastatin (LIPITOR) 20 MG tablet, Take 20 mg by mouth daily., Disp: , Rfl:    fluticasone (FLONASE) 50 MCG/ACT nasal spray, Place 1-2 sprays into both nostrils daily as needed for allergies., Disp: , Rfl:    levocetirizine (XYZAL) 5 MG tablet, Take 5 mg by mouth daily as needed., Disp: , Rfl:    Multiple Vitamins-Minerals (ONE A DAY MEN 50 PLUS) TABS, Take 1 tablet by mouth daily., Disp: , Rfl:   "

## 2024-12-14 ENCOUNTER — Ambulatory Visit: Admitting: Radiation Oncology

## 2024-12-14 ENCOUNTER — Encounter: Payer: Self-pay | Admitting: Oncology

## 2024-12-14 ENCOUNTER — Ambulatory Visit

## 2024-12-15 ENCOUNTER — Other Ambulatory Visit: Payer: Self-pay

## 2024-12-15 ENCOUNTER — Ambulatory Visit

## 2024-12-15 ENCOUNTER — Ambulatory Visit
Admission: RE | Admit: 2024-12-15 | Discharge: 2024-12-15 | Disposition: A | Discharge status: Home / Self Care | Source: Ambulatory Visit | Attending: Radiation Oncology | Admitting: Radiation Oncology

## 2024-12-15 ENCOUNTER — Inpatient Hospital Stay: Admitting: Oncology

## 2024-12-15 ENCOUNTER — Telehealth: Payer: Self-pay

## 2024-12-15 DIAGNOSIS — C3411 Malignant neoplasm of upper lobe, right bronchus or lung: Secondary | ICD-10-CM | POA: Diagnosis present

## 2024-12-15 LAB — RAD ONC ARIA SESSION SUMMARY
Course Elapsed Days: 0
Plan Fractions Treated to Date: 1
Plan Prescribed Dose Per Fraction: 2 Gy
Plan Total Fractions Prescribed: 33
Plan Total Prescribed Dose: 66 Gy
Reference Point Dosage Given to Date: 2 Gy
Reference Point Session Dosage Given: 2 Gy
Session Number: 1

## 2024-12-15 NOTE — Telephone Encounter (Signed)
 Pt called to ask directions on how to use the EMLA  cream. I told pt to place quarter size amount of cream, directly over port, cover w/ saran wrap - 30 -45 minutes prior top arrival. He verbalized understanding.

## 2024-12-16 ENCOUNTER — Inpatient Hospital Stay

## 2024-12-16 ENCOUNTER — Ambulatory Visit

## 2024-12-16 ENCOUNTER — Ambulatory Visit
Admission: RE | Admit: 2024-12-16 | Discharge: 2024-12-16 | Disposition: A | Source: Ambulatory Visit | Attending: Radiation Oncology

## 2024-12-16 ENCOUNTER — Other Ambulatory Visit: Payer: Self-pay | Admitting: Pharmacist

## 2024-12-16 ENCOUNTER — Other Ambulatory Visit: Payer: Self-pay

## 2024-12-16 VITALS — BP 134/77 | HR 68 | Temp 97.3°F | Resp 18

## 2024-12-16 DIAGNOSIS — C3481 Malignant neoplasm of overlapping sites of right bronchus and lung: Secondary | ICD-10-CM

## 2024-12-16 DIAGNOSIS — Z5111 Encounter for antineoplastic chemotherapy: Secondary | ICD-10-CM | POA: Diagnosis not present

## 2024-12-16 DIAGNOSIS — C3411 Malignant neoplasm of upper lobe, right bronchus or lung: Secondary | ICD-10-CM | POA: Diagnosis not present

## 2024-12-16 LAB — CBC WITH DIFFERENTIAL (CANCER CENTER ONLY)
Abs Immature Granulocytes: 0.07 K/uL (ref 0.00–0.07)
Basophils Absolute: 0 K/uL (ref 0.0–0.1)
Basophils Relative: 0 %
Eosinophils Absolute: 0.1 K/uL (ref 0.0–0.5)
Eosinophils Relative: 2 %
HCT: 41.7 % (ref 39.0–52.0)
Hemoglobin: 14.9 g/dL (ref 13.0–17.0)
Immature Granulocytes: 1 %
Lymphocytes Relative: 20 %
Lymphs Abs: 1.8 K/uL (ref 0.7–4.0)
MCH: 32 pg (ref 26.0–34.0)
MCHC: 35.7 g/dL (ref 30.0–36.0)
MCV: 89.5 fL (ref 80.0–100.0)
Monocytes Absolute: 0.7 K/uL (ref 0.1–1.0)
Monocytes Relative: 8 %
Neutro Abs: 6.2 K/uL (ref 1.7–7.7)
Neutrophils Relative %: 69 %
Platelet Count: 217 K/uL (ref 150–400)
RBC: 4.66 MIL/uL (ref 4.22–5.81)
RDW: 12.2 % (ref 11.5–15.5)
WBC Count: 8.9 K/uL (ref 4.0–10.5)
nRBC: 0 % (ref 0.0–0.2)

## 2024-12-16 LAB — RAD ONC ARIA SESSION SUMMARY
Course Elapsed Days: 1
Plan Fractions Treated to Date: 2
Plan Prescribed Dose Per Fraction: 2 Gy
Plan Total Fractions Prescribed: 33
Plan Total Prescribed Dose: 66 Gy
Reference Point Dosage Given to Date: 4 Gy
Reference Point Session Dosage Given: 2 Gy
Session Number: 2

## 2024-12-16 LAB — CMP (CANCER CENTER ONLY)
ALT: 37 U/L (ref 0–44)
AST: 26 U/L (ref 15–41)
Albumin: 4 g/dL (ref 3.5–5.0)
Alkaline Phosphatase: 93 U/L (ref 38–126)
Anion gap: 9 (ref 5–15)
BUN: 12 mg/dL (ref 8–23)
CO2: 26 mmol/L (ref 22–32)
Calcium: 9.2 mg/dL (ref 8.9–10.3)
Chloride: 103 mmol/L (ref 98–111)
Creatinine: 0.64 mg/dL (ref 0.61–1.24)
GFR, Estimated: >60 mL/min
Glucose, Bld: 197 mg/dL — ABNORMAL HIGH (ref 70–99)
Potassium: 3.9 mmol/L (ref 3.5–5.1)
Sodium: 137 mmol/L (ref 135–145)
Total Bilirubin: 0.7 mg/dL (ref 0.0–1.2)
Total Protein: 6.6 g/dL (ref 6.5–8.1)

## 2024-12-16 MED ORDER — DEXAMETHASONE SOD PHOSPHATE PF 10 MG/ML IJ SOLN
10.0000 mg | Freq: Once | INTRAMUSCULAR | Status: AC
  Administered 2024-12-16: 10 mg via INTRAVENOUS
  Filled 2024-12-16: qty 1

## 2024-12-16 MED ORDER — SODIUM CHLORIDE 0.9 % IV SOLN
45.0000 mg/m2 | Freq: Once | INTRAVENOUS | Status: AC
  Administered 2024-12-16: 90 mg via INTRAVENOUS
  Filled 2024-12-16: qty 15

## 2024-12-16 MED ORDER — SODIUM CHLORIDE 0.9 % IV SOLN
210.4000 mg | Freq: Once | INTRAVENOUS | Status: AC
  Administered 2024-12-16: 210 mg via INTRAVENOUS
  Filled 2024-12-16: qty 21

## 2024-12-16 MED ORDER — FAMOTIDINE IN NACL 20-0.9 MG/50ML-% IV SOLN
20.0000 mg | Freq: Once | INTRAVENOUS | Status: AC
  Administered 2024-12-16: 20 mg via INTRAVENOUS
  Filled 2024-12-16: qty 50

## 2024-12-16 MED ORDER — SODIUM CHLORIDE 0.9 % IV SOLN: 300.0000 mg | Freq: Once | INTRAVENOUS | Status: DC

## 2024-12-16 MED ORDER — SODIUM CHLORIDE 0.9% FLUSH: 10.0000 mL | INTRAVENOUS | Status: DC | PRN

## 2024-12-16 MED ORDER — PALONOSETRON HCL INJECTION 0.25 MG/5ML
0.2500 mg | Freq: Once | INTRAVENOUS | Status: AC
  Administered 2024-12-16: 0.25 mg via INTRAVENOUS
  Filled 2024-12-16: qty 5

## 2024-12-16 MED ORDER — DIPHENHYDRAMINE HCL 50 MG/ML IJ SOLN
50.0000 mg | Freq: Once | INTRAMUSCULAR | Status: AC
  Administered 2024-12-16: 50 mg via INTRAVENOUS
  Filled 2024-12-16: qty 1

## 2024-12-16 MED ORDER — SODIUM CHLORIDE 0.9 % IV SOLN: INTRAVENOUS | Status: DC

## 2024-12-16 NOTE — Patient Instructions (Signed)
 For the next three days- take the Prochlorperazine  as directed for nausea if you need it. After three days you can take the Ondansetron  or the Prochlorperazine .   After 3 days you may alternate these drugs if you prefer or you can stick with one. Remember the prochlorperazine  may make you sleepy.   Paclitaxel  Injection What is this medication? PACLITAXEL  (PAK li TAX el) treats some types of cancer. It works by slowing down the growth of cancer cells. This medicine may be used for other purposes; ask your health care provider or pharmacist if you have questions. COMMON BRAND NAME(S): Onxol, Taxol  What should I tell my care team before I take this medication? They need to know if you have any of these conditions: Heart disease Liver disease Low white blood cell levels An unusual or allergic reaction to paclitaxel , other medications, foods, dyes, or preservatives If you or your partner are pregnant or trying to get pregnant Breast-feeding How should I use this medication? This medication is injected into a vein. It is given by your care team in a hospital or clinic setting. Talk to your care team about the use of this medication in children. While it may be given to children for selected conditions, precautions do apply. Overdosage: If you think you have taken too much of this medicine contact a poison control center or emergency room at once. NOTE: This medicine is only for you. Do not share this medicine with others. What if I miss a dose? Keep appointments for follow-up doses. It is important not to miss your dose. Call your care team if you are unable to keep an appointment. What may interact with this medication? Do not take this medication with any of the following: Live virus vaccines Other medications may affect the way this medication works. Talk with your care team about all of the medications you take. They may suggest changes to your treatment plan to lower the risk of side  effects and to make sure your medications work as intended. This list may not describe all possible interactions. Give your health care provider a list of all the medicines, herbs, non-prescription drugs, or dietary supplements you use. Also tell them if you smoke, drink alcohol, or use illegal drugs. Some items may interact with your medicine. What should I watch for while using this medication? Your condition will be monitored carefully while you are receiving this medication. You may need blood work while taking this medication. This medication may make you feel generally unwell. This is not uncommon as chemotherapy can affect healthy cells as well as cancer cells. Report any side effects. Continue your course of treatment even though you feel ill unless your care team tells you to stop. This medication can cause serious allergic reactions. To reduce the risk, your care team may give you other medications to take before receiving this one. Be sure to follow the directions from your care team. This medication may increase your risk of getting an infection. Call your care team for advice if you get a fever, chills, sore throat, or other symptoms of a cold or flu. Do not treat yourself. Try to avoid being around people who are sick. This medication may increase your risk to bruise or bleed. Call your care team if you notice any unusual bleeding. Be careful brushing or flossing your teeth or using a toothpick because you may get an infection or bleed more easily. If you have any dental work done, tell your dentist you  are receiving this medication. Talk to your care team if you may be pregnant. Serious birth defects can occur if you take this medication during pregnancy. Talk to your care team before breastfeeding. Changes to your treatment plan may be needed. What side effects may I notice from receiving this medication? Side effects that you should report to your care team as soon as possible: Allergic  reactions--skin rash, itching, hives, swelling of the face, lips, tongue, or throat Heart rhythm changes--fast or irregular heartbeat, dizziness, feeling faint or lightheaded, chest pain, trouble breathing Increase in blood pressure Infection--fever, chills, cough, sore throat, wounds that don't heal, pain or trouble when passing urine, general feeling of discomfort or being unwell Low blood pressure--dizziness, feeling faint or lightheaded, blurry vision Low red blood cell level--unusual weakness or fatigue, dizziness, headache, trouble breathing Painful swelling, warmth, or redness of the skin, blisters or sores at the infusion site Pain, tingling, or numbness in the hands or feet Slow heartbeat--dizziness, feeling faint or lightheaded, confusion, trouble breathing, unusual weakness or fatigue Unusual bruising or bleeding Side effects that usually do not require medical attention (report to your care team if they continue or are bothersome): Diarrhea Hair loss Joint pain Loss of appetite Muscle pain Nausea Vomiting This list may not describe all possible side effects. Call your doctor for medical advice about side effects. You may report side effects to FDA at 1-800-FDA-1088. Where should I keep my medication? This medication is given in a hospital or clinic. It will not be stored at home. NOTE: This sheet is a summary. It may not cover all possible information. If you have questions about this medicine, talk to your doctor, pharmacist, or health care provider.  2024 Elsevier/Gold Standard (2022-04-15 00:00:00)Carboplatin  Injection What is this medication? CARBOPLATIN  (KAR boe pla tin) treats some types of cancer. It works by slowing down the growth of cancer cells. This medicine may be used for other purposes; ask your health care provider or pharmacist if you have questions. COMMON BRAND NAME(S): Paraplatin  What should I tell my care team before I take this medication? They need to  know if you have any of these conditions: Blood disorders Hearing problems Kidney disease Recent or ongoing radiation therapy An unusual or allergic reaction to carboplatin , cisplatin, other medications, foods, dyes, or preservatives Pregnant or trying to get pregnant Breast-feeding How should I use this medication? This medication is injected into a vein. It is given by your care team in a hospital or clinic setting. Talk to your care team about the use of this medication in children. Special care may be needed. Overdosage: If you think you have taken too much of this medicine contact a poison control center or emergency room at once. NOTE: This medicine is only for you. Do not share this medicine with others. What if I miss a dose? Keep appointments for follow-up doses. It is important not to miss your dose. Call your care team if you are unable to keep an appointment. What may interact with this medication? Medications for seizures Some antibiotics, such as amikacin, gentamicin, neomycin, streptomycin, tobramycin Vaccines This list may not describe all possible interactions. Give your health care provider a list of all the medicines, herbs, non-prescription drugs, or dietary supplements you use. Also tell them if you smoke, drink alcohol, or use illegal drugs. Some items may interact with your medicine. What should I watch for while using this medication? Your condition will be monitored carefully while you are receiving this medication. You  may need blood work while taking this medication. This medication may make you feel generally unwell. This is not uncommon, as chemotherapy can affect healthy cells as well as cancer cells. Report any side effects. Continue your course of treatment even though you feel ill unless your care team tells you to stop. In some cases, you may be given additional medications to help with side effects. Follow all directions for their use. This medication may  increase your risk of getting an infection. Call your care team for advice if you get a fever, chills, sore throat, or other symptoms of a cold or flu. Do not treat yourself. Try to avoid being around people who are sick. Avoid taking medications that contain aspirin, acetaminophen , ibuprofen, naproxen, or ketoprofen unless instructed by your care team. These medications may hide a fever. Be careful brushing or flossing your teeth or using a toothpick because you may get an infection or bleed more easily. If you have any dental work done, tell your dentist you are receiving this medication. Talk to your care team if you wish to become pregnant or think you might be pregnant. This medication can cause serious birth defects. Talk to your care team about effective forms of contraception. Do not breast-feed while taking this medication. What side effects may I notice from receiving this medication? Side effects that you should report to your care team as soon as possible: Allergic reactions--skin rash, itching, hives, swelling of the face, lips, tongue, or throat Infection--fever, chills, cough, sore throat, wounds that don't heal, pain or trouble when passing urine, general feeling of discomfort or being unwell Low red blood cell level--unusual weakness or fatigue, dizziness, headache, trouble breathing Pain, tingling, or numbness in the hands or feet, muscle weakness, change in vision, confusion or trouble speaking, loss of balance or coordination, trouble walking, seizures Unusual bruising or bleeding Side effects that usually do not require medical attention (report to your care team if they continue or are bothersome): Hair loss Nausea Unusual weakness or fatigue Vomiting This list may not describe all possible side effects. Call your doctor for medical advice about side effects. You may report side effects to FDA at 1-800-FDA-1088. Where should I keep my medication? This medication is given in a  hospital or clinic. It will not be stored at home. NOTE: This sheet is a summary. It may not cover all possible information. If you have questions about this medicine, talk to your doctor, pharmacist, or health care provider.  2024 Elsevier/Gold Standard (2022-03-18 00:00:00)

## 2024-12-16 NOTE — Progress Notes (Signed)
..  Pharmacist Chemotherapy Monitoring - Initial Assessment    Anticipated start date: 12/16/2024  The following has been reviewed per standard work regarding the patient's treatment regimen: The patient's diagnosis, treatment plan and drug doses, and organ/hematologic function Lab orders and baseline tests specific to treatment regimen  The treatment plan start date, drug sequencing, and pre-medications Prior authorization status  Patient's documented medication list, including drug-drug interaction screen and prescriptions for anti-emetics and supportive care specific to the treatment regimen The drug concentrations, fluid compatibility, administration routes, and timing of the medications to be used The patient's access for treatment and lifetime cumulative dose history, if applicable  The patient's medication allergies and previous infusion related reactions, if applicable   Changes made to treatment plan:  N/A  Follow up needed:  N/A   Daniel Long [place], [facility], 12/16/2024  10:06 AM

## 2024-12-19 ENCOUNTER — Other Ambulatory Visit: Payer: Self-pay

## 2024-12-19 ENCOUNTER — Ambulatory Visit
Admission: RE | Admit: 2024-12-19 | Discharge: 2024-12-19 | Disposition: A | Source: Ambulatory Visit | Attending: Radiation Oncology

## 2024-12-19 DIAGNOSIS — C3411 Malignant neoplasm of upper lobe, right bronchus or lung: Secondary | ICD-10-CM | POA: Diagnosis not present

## 2024-12-19 LAB — RAD ONC ARIA SESSION SUMMARY
Course Elapsed Days: 4
Plan Fractions Treated to Date: 3
Plan Prescribed Dose Per Fraction: 2 Gy
Plan Total Fractions Prescribed: 33
Plan Total Prescribed Dose: 66 Gy
Reference Point Dosage Given to Date: 6 Gy
Reference Point Session Dosage Given: 2 Gy
Session Number: 3

## 2024-12-20 ENCOUNTER — Other Ambulatory Visit: Payer: Self-pay

## 2024-12-20 ENCOUNTER — Ambulatory Visit
Admission: RE | Admit: 2024-12-20 | Discharge: 2024-12-20 | Disposition: A | Source: Ambulatory Visit | Attending: Radiation Oncology | Admitting: Radiation Oncology

## 2024-12-20 ENCOUNTER — Ambulatory Visit
Admission: RE | Admit: 2024-12-20 | Discharge: 2024-12-20 | Disposition: A | Source: Ambulatory Visit | Attending: Radiation Oncology

## 2024-12-20 DIAGNOSIS — C3411 Malignant neoplasm of upper lobe, right bronchus or lung: Secondary | ICD-10-CM | POA: Diagnosis not present

## 2024-12-20 LAB — RAD ONC ARIA SESSION SUMMARY
Course Elapsed Days: 5
Plan Fractions Treated to Date: 4
Plan Prescribed Dose Per Fraction: 2 Gy
Plan Total Fractions Prescribed: 33
Plan Total Prescribed Dose: 66 Gy
Reference Point Dosage Given to Date: 8 Gy
Reference Point Session Dosage Given: 2 Gy
Session Number: 4

## 2024-12-21 ENCOUNTER — Other Ambulatory Visit: Payer: Self-pay

## 2024-12-21 ENCOUNTER — Encounter: Payer: Self-pay | Admitting: Oncology

## 2024-12-21 ENCOUNTER — Ambulatory Visit
Admission: RE | Admit: 2024-12-21 | Discharge: 2024-12-21 | Disposition: A | Discharge status: Home / Self Care | Source: Ambulatory Visit | Attending: Radiation Oncology | Admitting: Radiation Oncology

## 2024-12-21 DIAGNOSIS — C3411 Malignant neoplasm of upper lobe, right bronchus or lung: Secondary | ICD-10-CM | POA: Diagnosis not present

## 2024-12-21 LAB — RAD ONC ARIA SESSION SUMMARY
Course Elapsed Days: 6
Plan Fractions Treated to Date: 5
Plan Prescribed Dose Per Fraction: 2 Gy
Plan Total Fractions Prescribed: 33
Plan Total Prescribed Dose: 66 Gy
Reference Point Dosage Given to Date: 10 Gy
Reference Point Session Dosage Given: 2 Gy
Session Number: 5

## 2024-12-22 ENCOUNTER — Other Ambulatory Visit: Payer: Self-pay | Admitting: Oncology

## 2024-12-22 ENCOUNTER — Ambulatory Visit
Admission: RE | Admit: 2024-12-22 | Discharge: 2024-12-22 | Disposition: A | Source: Ambulatory Visit | Attending: Radiation Oncology

## 2024-12-22 ENCOUNTER — Other Ambulatory Visit: Payer: Self-pay

## 2024-12-22 DIAGNOSIS — C3411 Malignant neoplasm of upper lobe, right bronchus or lung: Secondary | ICD-10-CM | POA: Diagnosis not present

## 2024-12-22 DIAGNOSIS — C3481 Malignant neoplasm of overlapping sites of right bronchus and lung: Secondary | ICD-10-CM

## 2024-12-22 LAB — RAD ONC ARIA SESSION SUMMARY
Course Elapsed Days: 7
Plan Fractions Treated to Date: 6
Plan Prescribed Dose Per Fraction: 2 Gy
Plan Total Fractions Prescribed: 33
Plan Total Prescribed Dose: 66 Gy
Reference Point Dosage Given to Date: 12 Gy
Reference Point Session Dosage Given: 2 Gy
Session Number: 6

## 2024-12-22 NOTE — Progress Notes (Signed)
 " [facility] [address] [place],  [place]  [address] [phone]  Clinic Day:  12/23/2024  Referring physician: Benjamine [name] DASEN, NP   HISTORY OF PRESENT ILLNESS:  The patient is a 69 y.o. 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  He also appears to have a very early stage I adenocarcinoma in his left lower lobe.  He comes in today before heading into his 2nd week of chemoradiation.  Thus far he has tolerated his definitive therapy very well.  He continues to deny having any respiratory symptoms which concern him for disease progression.  PHYSICAL EXAM:  Blood pressure 117/67, pulse 67, temperature 98.1 F (36.7 C), temperature source Oral, resp. rate 16, height 5' 9.29 (1.76 m), weight 176 lb 1.6 oz (79.9 kg), SpO2 91%. Wt Readings from Last 3 Encounters:  12/23/24 176 lb 1.6 oz (79.9 kg)  12/13/24 175 lb 9.6 oz (79.7 kg)  11/17/24 176 lb 14.4 oz (80.2 kg)   Body mass index is 25.79 kg/m. Performance status (ECOG): 0 - Asymptomatic Physical Exam Constitutional:      Appearance: Normal appearance. He is not ill-appearing.  HENT:     Mouth/Throat:     Mouth: Mucous membranes are moist.     Pharynx: Oropharynx is clear. No oropharyngeal exudate or posterior oropharyngeal erythema.  Cardiovascular:     Rate and Rhythm: Normal rate and regular rhythm.     Heart sounds: No murmur heard.    No friction rub. No gallop.  Pulmonary:     Effort: Pulmonary effort is normal. No respiratory distress.     Breath sounds: Normal breath sounds. No wheezing, rhonchi or rales.  Abdominal:     General: Bowel sounds are normal. There is no distension.     Palpations: Abdomen is soft. There is no mass.     Tenderness: There is no abdominal tenderness.  Musculoskeletal:        General: No swelling.     Right lower leg: No edema.     Left lower leg: No edema.  Lymphadenopathy:     Cervical: No  cervical adenopathy.     Upper Body:     Right upper body: No supraclavicular or axillary adenopathy.     Left upper body: No supraclavicular or axillary adenopathy.     Lower Body: No right inguinal adenopathy. No left inguinal adenopathy.  Skin:    General: Skin is warm.     Coloration: Skin is not jaundiced.     Findings: No lesion or rash.  Neurological:     General: No focal deficit present.     Mental Status: He is alert and oriented to person, place, and time. Mental status is at baseline.  Psychiatric:        Mood and Affect: Mood normal.        Behavior: Behavior normal.        Thought Content: Thought content normal.    PATHOLOGY: His 2nd bronchoscopy/EBUS with biopsy on 11-08-24 revealed the following:  Lung, biopsy, RUL endobronchial lesion :      NON-SMALL CELL CARCINOMA, FAVOR ADENOCARCINOMA.      SEE NOTE.       Diagnosis Note : The specimen contains limited pulmonary parenchyma with limites      atypical large cells with rare mitosis. Immunohistochemical stains including      TTF-1, p40, synaptophysin, chromogranin A, Ki67 were attempted to characterize  the cells. However the stains are not-contributory due to the insufficiency of      the tumor cells. Please correlate the concurrent cytology specimen.      Controls worked appropriately.      The case was peer-reviewed by Dr. Reed who agrees with the diagnosis.   FINAL DIAGNOSIS  STATEMENT Of SPECIMEN ADEQUACY:   INTERPRETATION(S):      NON-SMALL CELL CARCINOMA, CONSISTENT WITH ADENOCARCINOMA.       NON-SMALL CELL CARCINOMA, CONSISTENT WITH ADENOCARCINOMA.       NON-SMALL CELL CARCINOMA, CONSISTENT WITH ADENOCARCINOMA.       LYMPHOCYTES PRESENT.   Diagnosis Note : Immunohistochemical stains for TTF-1 and p40 were performed on  part 1 and part3.The stain for TTF-1 highlights the malignant cells supporting  the diagnosis of adenocarcinoma.   ----------------------------------------------------------------------------------------------------------- FINAL MICROSCOPIC DIAGNOSIS:  A. LUNG, RUL, FINE NEEDLE ASPIRATION  BIOPSIES:  - Adenocarcinoma  - See comment.   COMMENT:  The specimen shows scant atypical epithelioid cells. Immunohistochemical  stains show the cells are positive for TTF-1 and are negative for p40.  The findings are consistent with adenocarcinoma.   B. LUNG, LLL TARGET #2, FINE NEEDLE ASPIRATION  BIOPSIES:  - Adenocarcinoma  - See comment   C. LUNG, LLL TARGET #2, BRUSHING:  - Rare atypical cells present  - Predominantly blood   LABS:      Latest Ref Rng & Units 12/23/2024    9:49 AM 12/16/2024    8:51 AM 10/27/2024    2:45 PM  CBC  WBC 4.0 - 10.5 K/uL 9.3  8.9  8.8   Hemoglobin 13.0 - 17.0 g/dL 85.8  85.0  84.3   Hematocrit 39.0 - 52.0 % 38.9  41.7  45.7   Platelets 150 - 400 K/uL 229  217  248       Latest Ref Rng & Units 12/23/2024    9:49 AM 12/16/2024    8:51 AM 10/27/2024    2:45 PM  CMP  Glucose 70 - 99 mg/dL 92  802  883   BUN 8 - 23 mg/dL 11  12  14    Creatinine 0.61 - 1.24 mg/dL 9.42  9.35  9.36   Sodium 135 - 145 mmol/L 138  137  141   Potassium 3.5 - 5.1 mmol/L 4.0  3.9  4.1   Chloride 98 - 111 mmol/L 104  103  105   CO2 22 - 32 mmol/L 27  26  28    Calcium 8.9 - 10.3 mg/dL 9.0  9.2  9.0   Total Protein 6.5 - 8.1 g/dL 6.3  6.6  6.9   Total Bilirubin 0.0 - 1.2 mg/dL 0.7  0.7  0.3   Alkaline Phos 38 - 126 U/L 80  93  95   AST 15 - 41 U/L 27  26  33   ALT 0 - 44 U/L 39  37  53      ASSESSMENT & PLAN:  A 69 y.o. male with bilateral lung adenocarcinomas.  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  He also appears to have a very early stage I adenocarcinoma in his left lower lobe.  The patient will proceed with his second week of concurrent chemoradiation, which consists of weekly carboplatin /paclitaxel .  Clinically, the patient appears to be doing very  well.  I will see him back in 2 weeks before he has into his fourth week of chemoradiation.  The patient understands all the  plans discussed today and is in agreement with them.   Calyssa Zobrist DELENA Kerns, MD       "

## 2024-12-23 ENCOUNTER — Other Ambulatory Visit: Payer: Self-pay

## 2024-12-23 ENCOUNTER — Inpatient Hospital Stay

## 2024-12-23 ENCOUNTER — Inpatient Hospital Stay: Admitting: Oncology

## 2024-12-23 ENCOUNTER — Other Ambulatory Visit: Payer: Self-pay | Admitting: Oncology

## 2024-12-23 ENCOUNTER — Ambulatory Visit
Admission: RE | Admit: 2024-12-23 | Discharge: 2024-12-23 | Disposition: A | Discharge status: Home / Self Care | Source: Ambulatory Visit | Attending: Radiation Oncology | Admitting: Radiation Oncology

## 2024-12-23 VITALS — BP 118/80 | HR 75 | Resp 16

## 2024-12-23 VITALS — BP 117/67 | HR 67 | Temp 98.1°F | Resp 16 | Ht 69.29 in | Wt 176.1 lb

## 2024-12-23 DIAGNOSIS — C3481 Malignant neoplasm of overlapping sites of right bronchus and lung: Secondary | ICD-10-CM

## 2024-12-23 DIAGNOSIS — C3411 Malignant neoplasm of upper lobe, right bronchus or lung: Secondary | ICD-10-CM | POA: Diagnosis not present

## 2024-12-23 DIAGNOSIS — Z5111 Encounter for antineoplastic chemotherapy: Secondary | ICD-10-CM | POA: Diagnosis not present

## 2024-12-23 LAB — RAD ONC ARIA SESSION SUMMARY
Course Elapsed Days: 8
Plan Fractions Treated to Date: 7
Plan Prescribed Dose Per Fraction: 2 Gy
Plan Total Fractions Prescribed: 33
Plan Total Prescribed Dose: 66 Gy
Reference Point Dosage Given to Date: 14 Gy
Reference Point Session Dosage Given: 2 Gy
Session Number: 7

## 2024-12-23 LAB — CBC WITH DIFFERENTIAL (CANCER CENTER ONLY)
Abs Immature Granulocytes: 0.1 K/uL — ABNORMAL HIGH (ref 0.00–0.07)
Basophils Absolute: 0 K/uL (ref 0.0–0.1)
Basophils Relative: 0 %
Eosinophils Absolute: 0.2 K/uL (ref 0.0–0.5)
Eosinophils Relative: 2 %
HCT: 38.9 % — ABNORMAL LOW (ref 39.0–52.0)
Hemoglobin: 14.1 g/dL (ref 13.0–17.0)
Immature Granulocytes: 1 %
Lymphocytes Relative: 21 %
Lymphs Abs: 2 K/uL (ref 0.7–4.0)
MCH: 32.5 pg (ref 26.0–34.0)
MCHC: 36.2 g/dL — ABNORMAL HIGH (ref 30.0–36.0)
MCV: 89.6 fL (ref 80.0–100.0)
Monocytes Absolute: 0.7 K/uL (ref 0.1–1.0)
Monocytes Relative: 7 %
Neutro Abs: 6.4 K/uL (ref 1.7–7.7)
Neutrophils Relative %: 69 %
Platelet Count: 229 K/uL (ref 150–400)
RBC: 4.34 MIL/uL (ref 4.22–5.81)
RDW: 11.9 % (ref 11.5–15.5)
WBC Count: 9.3 K/uL (ref 4.0–10.5)
nRBC: 0 % (ref 0.0–0.2)

## 2024-12-23 LAB — CMP (CANCER CENTER ONLY)
ALT: 39 U/L (ref 0–44)
AST: 27 U/L (ref 15–41)
Albumin: 3.9 g/dL (ref 3.5–5.0)
Alkaline Phosphatase: 80 U/L (ref 38–126)
Anion gap: 7 (ref 5–15)
BUN: 11 mg/dL (ref 8–23)
CO2: 27 mmol/L (ref 22–32)
Calcium: 9 mg/dL (ref 8.9–10.3)
Chloride: 104 mmol/L (ref 98–111)
Creatinine: 0.57 mg/dL — ABNORMAL LOW (ref 0.61–1.24)
GFR, Estimated: >60 mL/min
Glucose, Bld: 92 mg/dL (ref 70–99)
Potassium: 4 mmol/L (ref 3.5–5.1)
Sodium: 138 mmol/L (ref 135–145)
Total Bilirubin: 0.7 mg/dL (ref 0.0–1.2)
Total Protein: 6.3 g/dL — ABNORMAL LOW (ref 6.5–8.1)

## 2024-12-23 LAB — ACID FAST CULTURE WITH REFLEXED SENSITIVITIES (MYCOBACTERIA): Acid Fast Culture: NEGATIVE

## 2024-12-23 MED ORDER — PALONOSETRON HCL INJECTION 0.25 MG/5ML
0.2500 mg | Freq: Once | INTRAVENOUS | Status: AC
  Administered 2024-12-23: 0.25 mg via INTRAVENOUS
  Filled 2024-12-23: qty 5

## 2024-12-23 MED ORDER — DEXAMETHASONE SOD PHOSPHATE PF 10 MG/ML IJ SOLN
10.0000 mg | Freq: Once | INTRAMUSCULAR | Status: AC
  Administered 2024-12-23: 10 mg via INTRAVENOUS
  Filled 2024-12-23: qty 1

## 2024-12-23 MED ORDER — DIPHENHYDRAMINE HCL 50 MG/ML IJ SOLN
50.0000 mg | Freq: Once | INTRAMUSCULAR | Status: AC
  Administered 2024-12-23: 50 mg via INTRAVENOUS
  Filled 2024-12-23: qty 1

## 2024-12-23 MED ORDER — SODIUM CHLORIDE 0.9 % IV SOLN
45.0000 mg/m2 | Freq: Once | INTRAVENOUS | Status: AC
  Administered 2024-12-23: 90 mg via INTRAVENOUS
  Filled 2024-12-23: qty 15

## 2024-12-23 MED ORDER — SODIUM CHLORIDE 0.9 % IV SOLN: INTRAVENOUS | Status: DC

## 2024-12-23 MED ORDER — SODIUM CHLORIDE 0.9% FLUSH: 10.0000 mL | INTRAVENOUS | Status: DC | PRN

## 2024-12-23 MED ORDER — FAMOTIDINE IN NACL 20-0.9 MG/50ML-% IV SOLN
20.0000 mg | Freq: Once | INTRAVENOUS | Status: AC
  Administered 2024-12-23: 20 mg via INTRAVENOUS
  Filled 2024-12-23: qty 50

## 2024-12-23 MED ORDER — SODIUM CHLORIDE 0.9 % IV SOLN
210.4000 mg | Freq: Once | INTRAVENOUS | Status: AC
  Administered 2024-12-23: 210 mg via INTRAVENOUS
  Filled 2024-12-23: qty 21

## 2024-12-23 NOTE — Patient Instructions (Signed)
 Carboplatin  Injection What is this medication? CARBOPLATIN  (KAR boe pla tin) treats some types of cancer. It works by slowing down the growth of cancer cells. This medicine may be used for other purposes; ask your health care provider or pharmacist if you have questions. COMMON BRAND NAME(S): Paraplatin  What should I tell my care team before I take this medication? They need to know if you have any of these conditions: Blood disorders Hearing problems Kidney disease Recent or ongoing radiation therapy An unusual or allergic reaction to carboplatin , cisplatin, other medications, foods, dyes, or preservatives Pregnant or trying to get pregnant Breast-feeding How should I use this medication? This medication is injected into a vein. It is given by your care team in a hospital or clinic setting. Talk to your care team about the use of this medication in children. Special care may be needed. Overdosage: If you think you have taken too much of this medicine contact a poison control center or emergency room at once. NOTE: This medicine is only for you. Do not share this medicine with others. What if I miss a dose? Keep appointments for follow-up doses. It is important not to miss your dose. Call your care team if you are unable to keep an appointment. What may interact with this medication? Medications for seizures Some antibiotics, such as amikacin, gentamicin, neomycin, streptomycin, tobramycin Vaccines This list may not describe all possible interactions. Give your health care provider a list of all the medicines, herbs, non-prescription drugs, or dietary supplements you use. Also tell them if you smoke, drink alcohol, or use illegal drugs. Some items may interact with your medicine. What should I watch for while using this medication? Your condition will be monitored carefully while you are receiving this medication. You may need blood work while taking this medication. This medication may  make you feel generally unwell. This is not uncommon, as chemotherapy can affect healthy cells as well as cancer cells. Report any side effects. Continue your course of treatment even though you feel ill unless your care team tells you to stop. In some cases, you may be given additional medications to help with side effects. Follow all directions for their use. This medication may increase your risk of getting an infection. Call your care team for advice if you get a fever, chills, sore throat, or other symptoms of a cold or flu. Do not treat yourself. Try to avoid being around people who are sick. Avoid taking medications that contain aspirin, acetaminophen , ibuprofen, naproxen, or ketoprofen unless instructed by your care team. These medications may hide a fever. Be careful brushing or flossing your teeth or using a toothpick because you may get an infection or bleed more easily. If you have any dental work done, tell your dentist you are receiving this medication. Talk to your care team if you wish to become pregnant or think you might be pregnant. This medication can cause serious birth defects. Talk to your care team about effective forms of contraception. Do not breast-feed while taking this medication. What side effects may I notice from receiving this medication? Side effects that you should report to your care team as soon as possible: Allergic reactions--skin rash, itching, hives, swelling of the face, lips, tongue, or throat Infection--fever, chills, cough, sore throat, wounds that don't heal, pain or trouble when passing urine, general feeling of discomfort or being unwell Low red blood cell level--unusual weakness or fatigue, dizziness, headache, trouble breathing Pain, tingling, or numbness in the hands or  feet, muscle weakness, change in vision, confusion or trouble speaking, loss of balance or coordination, trouble walking, seizures Unusual bruising or bleeding Side effects that usually  do not require medical attention (report to your care team if they continue or are bothersome): Hair loss Nausea Unusual weakness or fatigue Vomiting This list may not describe all possible side effects. Call your doctor for medical advice about side effects. You may report side effects to FDA at 1-800-FDA-1088. Where should I keep my medication? This medication is given in a hospital or clinic. It will not be stored at home. NOTE: This sheet is a summary. It may not cover all possible information. If you have questions about this medicine, talk to your doctor, pharmacist, or health care provider.  2024 [employer] Standard (2022-03-18 00:00:00)Paclitaxel  Injection What is this medication? PACLITAXEL  (PAK li TAX el) treats some types of cancer. It works by slowing down the growth of cancer cells. This medicine may be used for other purposes; ask your health care provider or pharmacist if you have questions. COMMON BRAND NAME(S): Onxol, Taxol  What should I tell my care team before I take this medication? They need to know if you have any of these conditions: Heart disease Liver disease Low white blood cell levels An unusual or allergic reaction to paclitaxel , other medications, foods, dyes, or preservatives If you or your partner are pregnant or trying to get pregnant Breast-feeding How should I use this medication? This medication is injected into a vein. It is given by your care team in a hospital or clinic setting. Talk to your care team about the use of this medication in children. While it may be given to children for selected conditions, precautions do apply. Overdosage: If you think you have taken too much of this medicine contact a poison control center or emergency room at once. NOTE: This medicine is only for you. Do not share this medicine with others. What if I miss a dose? Keep appointments for follow-up doses. It is important not to miss your dose. Call your care team if  you are unable to keep an appointment. What may interact with this medication? Do not take this medication with any of the following: Live virus vaccines Other medications may affect the way this medication works. Talk with your care team about all of the medications you take. They may suggest changes to your treatment plan to lower the risk of side effects and to make sure your medications work as intended. This list may not describe all possible interactions. Give your health care provider a list of all the medicines, herbs, non-prescription drugs, or dietary supplements you use. Also tell them if you smoke, drink alcohol, or use illegal drugs. Some items may interact with your medicine. What should I watch for while using this medication? Your condition will be monitored carefully while you are receiving this medication. You may need blood work while taking this medication. This medication may make you feel generally unwell. This is not uncommon as chemotherapy can affect healthy cells as well as cancer cells. Report any side effects. Continue your course of treatment even though you feel ill unless your care team tells you to stop. This medication can cause serious allergic reactions. To reduce the risk, your care team may give you other medications to take before receiving this one. Be sure to follow the directions from your care team. This medication may increase your risk of getting an infection. Call your care team for advice if you  get a fever, chills, sore throat, or other symptoms of a cold or flu. Do not treat yourself. Try to avoid being around people who are sick. This medication may increase your risk to bruise or bleed. Call your care team if you notice any unusual bleeding. Be careful brushing or flossing your teeth or using a toothpick because you may get an infection or bleed more easily. If you have any dental work done, tell your dentist you are receiving this medication. Talk to  your care team if you may be pregnant. Serious birth defects can occur if you take this medication during pregnancy. Talk to your care team before breastfeeding. Changes to your treatment plan may be needed. What side effects may I notice from receiving this medication? Side effects that you should report to your care team as soon as possible: Allergic reactions--skin rash, itching, hives, swelling of the face, lips, tongue, or throat Heart rhythm changes--fast or irregular heartbeat, dizziness, feeling faint or lightheaded, chest pain, trouble breathing Increase in blood pressure Infection--fever, chills, cough, sore throat, wounds that don't heal, pain or trouble when passing urine, general feeling of discomfort or being unwell Low blood pressure--dizziness, feeling faint or lightheaded, blurry vision Low red blood cell level--unusual weakness or fatigue, dizziness, headache, trouble breathing Painful swelling, warmth, or redness of the skin, blisters or sores at the infusion site Pain, tingling, or numbness in the hands or feet Slow heartbeat--dizziness, feeling faint or lightheaded, confusion, trouble breathing, unusual weakness or fatigue Unusual bruising or bleeding Side effects that usually do not require medical attention (report to your care team if they continue or are bothersome): Diarrhea Hair loss Joint pain Loss of appetite Muscle pain Nausea Vomiting This list may not describe all possible side effects. Call your doctor for medical advice about side effects. You may report side effects to FDA at 1-800-FDA-1088. Where should I keep my medication? This medication is given in a hospital or clinic. It will not be stored at home. NOTE: This sheet is a summary. It may not cover all possible information. If you have questions about this medicine, talk to your doctor, pharmacist, or health care provider.  2024 [employer] Standard (2022-04-15 00:00:00)

## 2024-12-26 ENCOUNTER — Ambulatory Visit
Admission: RE | Admit: 2024-12-26 | Discharge: 2024-12-26 | Disposition: A | Source: Ambulatory Visit | Attending: Radiation Oncology

## 2024-12-26 ENCOUNTER — Other Ambulatory Visit: Payer: Self-pay

## 2024-12-26 DIAGNOSIS — C3411 Malignant neoplasm of upper lobe, right bronchus or lung: Secondary | ICD-10-CM | POA: Diagnosis not present

## 2024-12-26 LAB — RAD ONC ARIA SESSION SUMMARY
Course Elapsed Days: 11
Plan Fractions Treated to Date: 8
Plan Prescribed Dose Per Fraction: 2 Gy
Plan Total Fractions Prescribed: 33
Plan Total Prescribed Dose: 66 Gy
Reference Point Dosage Given to Date: 16 Gy
Reference Point Session Dosage Given: 2 Gy
Session Number: 8

## 2024-12-27 ENCOUNTER — Ambulatory Visit
Admission: RE | Admit: 2024-12-27 | Discharge: 2024-12-27 | Disposition: A | Source: Ambulatory Visit | Attending: Radiation Oncology

## 2024-12-27 ENCOUNTER — Other Ambulatory Visit: Payer: Self-pay

## 2024-12-27 ENCOUNTER — Ambulatory Visit
Admission: RE | Admit: 2024-12-27 | Discharge: 2024-12-27 | Disposition: A | Source: Ambulatory Visit | Attending: Radiation Oncology | Admitting: Radiation Oncology

## 2024-12-27 DIAGNOSIS — C3411 Malignant neoplasm of upper lobe, right bronchus or lung: Secondary | ICD-10-CM | POA: Diagnosis not present

## 2024-12-27 LAB — RAD ONC ARIA SESSION SUMMARY
Course Elapsed Days: 12
Plan Fractions Treated to Date: 9
Plan Prescribed Dose Per Fraction: 2 Gy
Plan Total Fractions Prescribed: 33
Plan Total Prescribed Dose: 66 Gy
Reference Point Dosage Given to Date: 18 Gy
Reference Point Session Dosage Given: 2 Gy
Session Number: 9

## 2024-12-28 ENCOUNTER — Ambulatory Visit
Admission: RE | Admit: 2024-12-28 | Discharge: 2024-12-28 | Disposition: A | Source: Ambulatory Visit | Attending: Radiation Oncology

## 2024-12-28 ENCOUNTER — Other Ambulatory Visit: Payer: Self-pay

## 2024-12-28 DIAGNOSIS — C3411 Malignant neoplasm of upper lobe, right bronchus or lung: Secondary | ICD-10-CM | POA: Diagnosis not present

## 2024-12-28 LAB — RAD ONC ARIA SESSION SUMMARY
Course Elapsed Days: 13
Plan Fractions Treated to Date: 10
Plan Prescribed Dose Per Fraction: 2 Gy
Plan Total Fractions Prescribed: 33
Plan Total Prescribed Dose: 66 Gy
Reference Point Dosage Given to Date: 20 Gy
Reference Point Session Dosage Given: 2 Gy
Session Number: 10

## 2024-12-29 ENCOUNTER — Other Ambulatory Visit: Payer: Self-pay

## 2024-12-29 ENCOUNTER — Ambulatory Visit
Admission: RE | Admit: 2024-12-29 | Discharge: 2024-12-29 | Disposition: A | Discharge status: Home / Self Care | Source: Ambulatory Visit | Attending: Radiation Oncology | Admitting: Radiation Oncology

## 2024-12-29 DIAGNOSIS — C3411 Malignant neoplasm of upper lobe, right bronchus or lung: Secondary | ICD-10-CM | POA: Diagnosis not present

## 2024-12-29 LAB — RAD ONC ARIA SESSION SUMMARY
Course Elapsed Days: 14
Plan Fractions Treated to Date: 11
Plan Prescribed Dose Per Fraction: 2 Gy
Plan Total Fractions Prescribed: 33
Plan Total Prescribed Dose: 66 Gy
Reference Point Dosage Given to Date: 22 Gy
Reference Point Session Dosage Given: 2 Gy
Session Number: 11

## 2024-12-30 ENCOUNTER — Inpatient Hospital Stay

## 2024-12-30 ENCOUNTER — Other Ambulatory Visit: Payer: Self-pay

## 2024-12-30 ENCOUNTER — Ambulatory Visit
Admission: RE | Admit: 2024-12-30 | Discharge: 2024-12-30 | Disposition: A | Source: Ambulatory Visit | Attending: Radiation Oncology

## 2024-12-30 VITALS — BP 132/69 | HR 71 | Temp 98.1°F | Resp 16

## 2024-12-30 DIAGNOSIS — C3481 Malignant neoplasm of overlapping sites of right bronchus and lung: Secondary | ICD-10-CM

## 2024-12-30 DIAGNOSIS — Z5111 Encounter for antineoplastic chemotherapy: Secondary | ICD-10-CM | POA: Diagnosis not present

## 2024-12-30 DIAGNOSIS — C3411 Malignant neoplasm of upper lobe, right bronchus or lung: Secondary | ICD-10-CM | POA: Diagnosis not present

## 2024-12-30 LAB — CBC WITH DIFFERENTIAL (CANCER CENTER ONLY)
Abs Immature Granulocytes: 0.08 K/uL — ABNORMAL HIGH (ref 0.00–0.07)
Basophils Absolute: 0 K/uL (ref 0.0–0.1)
Basophils Relative: 0 %
Eosinophils Absolute: 0.1 K/uL (ref 0.0–0.5)
Eosinophils Relative: 1 %
HCT: 36.1 % — ABNORMAL LOW (ref 39.0–52.0)
Hemoglobin: 12.8 g/dL — ABNORMAL LOW (ref 13.0–17.0)
Immature Granulocytes: 1 %
Lymphocytes Relative: 16 %
Lymphs Abs: 1 K/uL (ref 0.7–4.0)
MCH: 31.8 pg (ref 26.0–34.0)
MCHC: 35.5 g/dL (ref 30.0–36.0)
MCV: 89.8 fL (ref 80.0–100.0)
Monocytes Absolute: 0.4 K/uL (ref 0.1–1.0)
Monocytes Relative: 7 %
Neutro Abs: 4.8 K/uL (ref 1.7–7.7)
Neutrophils Relative %: 75 %
Platelet Count: 231 K/uL (ref 150–400)
RBC: 4.02 MIL/uL — ABNORMAL LOW (ref 4.22–5.81)
RDW: 12 % (ref 11.5–15.5)
WBC Count: 6.4 K/uL (ref 4.0–10.5)
nRBC: 0 % (ref 0.0–0.2)

## 2024-12-30 LAB — CMP (CANCER CENTER ONLY)
ALT: 33 U/L (ref 0–44)
AST: 19 U/L (ref 15–41)
Albumin: 3.3 g/dL — ABNORMAL LOW (ref 3.5–5.0)
Alkaline Phosphatase: 75 U/L (ref 38–126)
Anion gap: 7 (ref 5–15)
BUN: 12 mg/dL (ref 8–23)
CO2: 27 mmol/L (ref 22–32)
Calcium: 9.1 mg/dL (ref 8.9–10.3)
Chloride: 105 mmol/L (ref 98–111)
Creatinine: 0.55 mg/dL — ABNORMAL LOW (ref 0.61–1.24)
GFR, Estimated: >60 mL/min
Glucose, Bld: 122 mg/dL — ABNORMAL HIGH (ref 70–99)
Potassium: 4.1 mmol/L (ref 3.5–5.1)
Sodium: 139 mmol/L (ref 135–145)
Total Bilirubin: 0.3 mg/dL (ref 0.0–1.2)
Total Protein: 6 g/dL — ABNORMAL LOW (ref 6.5–8.1)

## 2024-12-30 LAB — RAD ONC ARIA SESSION SUMMARY
Course Elapsed Days: 15
Plan Fractions Treated to Date: 12
Plan Prescribed Dose Per Fraction: 2 Gy
Plan Total Fractions Prescribed: 33
Plan Total Prescribed Dose: 66 Gy
Reference Point Dosage Given to Date: 24 Gy
Reference Point Session Dosage Given: 2 Gy
Session Number: 12

## 2024-12-30 MED ORDER — DIPHENHYDRAMINE HCL 50 MG/ML IJ SOLN
25.0000 mg | Freq: Once | INTRAMUSCULAR | Status: AC
  Administered 2024-12-30: 25 mg via INTRAVENOUS
  Filled 2024-12-30: qty 1

## 2024-12-30 MED ORDER — SODIUM CHLORIDE 0.9% FLUSH: 10.0000 mL | INTRAVENOUS | Status: DC | PRN

## 2024-12-30 MED ORDER — SODIUM CHLORIDE 0.9 % IV SOLN
210.4000 mg | Freq: Once | INTRAVENOUS | Status: AC
  Administered 2024-12-30: 210 mg via INTRAVENOUS
  Filled 2024-12-30: qty 21

## 2024-12-30 MED ORDER — SODIUM CHLORIDE 0.9 % IV SOLN: INTRAVENOUS | Status: DC

## 2024-12-30 MED ORDER — PALONOSETRON HCL INJECTION 0.25 MG/5ML
0.2500 mg | Freq: Once | INTRAVENOUS | Status: AC
  Administered 2024-12-30: 0.25 mg via INTRAVENOUS
  Filled 2024-12-30: qty 5

## 2024-12-30 MED ORDER — DEXAMETHASONE SOD PHOSPHATE PF 10 MG/ML IJ SOLN
10.0000 mg | Freq: Once | INTRAMUSCULAR | Status: AC
  Administered 2024-12-30: 10 mg via INTRAVENOUS
  Filled 2024-12-30: qty 1

## 2024-12-30 MED ORDER — SODIUM CHLORIDE 0.9 % IV SOLN
45.0000 mg/m2 | Freq: Once | INTRAVENOUS | Status: AC
  Administered 2024-12-30: 90 mg via INTRAVENOUS
  Filled 2024-12-30: qty 15

## 2024-12-30 MED ORDER — FAMOTIDINE IN NACL 20-0.9 MG/50ML-% IV SOLN
20.0000 mg | Freq: Once | INTRAVENOUS | Status: AC
  Administered 2024-12-30: 20 mg via INTRAVENOUS
  Filled 2024-12-30: qty 50

## 2024-12-30 NOTE — Patient Instructions (Signed)
Paclitaxel Injection What is this medication? PACLITAXEL (PAK li TAX el) treats some types of cancer. It works by slowing down the growth of cancer cells. This medicine may be used for other purposes; ask your health care provider or pharmacist if you have questions. COMMON BRAND NAME(S): Onxol, Taxol What should I tell my care team before I take this medication? They need to know if you have any of these conditions: Heart disease Liver disease Low white blood cell levels An unusual or allergic reaction to paclitaxel, other medications, foods, dyes, or preservatives If you or your partner are pregnant or trying to get pregnant Breast-feeding How should I use this medication? This medication is injected into a vein. It is given by your care team in a hospital or clinic setting. Talk to your care team about the use of this medication in children. While it may be given to children for selected conditions, precautions do apply. Overdosage: If you think you have taken too much of this medicine contact a poison control center or emergency room at once. NOTE: This medicine is only for you. Do not share this medicine with others. What if I miss a dose? Keep appointments for follow-up doses. It is important not to miss your dose. Call your care team if you are unable to keep an appointment. What may interact with this medication? Do not take this medication with any of the following: Live virus vaccines Other medications may affect the way this medication works. Talk with your care team about all of the medications you take. They may suggest changes to your treatment plan to lower the risk of side effects and to make sure your medications work as intended. This list may not describe all possible interactions. Give your health care provider a list of all the medicines, herbs, non-prescription drugs, or dietary supplements you use. Also tell them if you smoke, drink alcohol, or use illegal drugs. Some  items may interact with your medicine. What should I watch for while using this medication? Your condition will be monitored carefully while you are receiving this medication. You may need blood work while taking this medication. This medication may make you feel generally unwell. This is not uncommon as chemotherapy can affect healthy cells as well as cancer cells. Report any side effects. Continue your course of treatment even though you feel ill unless your care team tells you to stop. This medication can cause serious allergic reactions. To reduce the risk, your care team may give you other medications to take before receiving this one. Be sure to follow the directions from your care team. This medication may increase your risk of getting an infection. Call your care team for advice if you get a fever, chills, sore throat, or other symptoms of a cold or flu. Do not treat yourself. Try to avoid being around people who are sick. This medication may increase your risk to bruise or bleed. Call your care team if you notice any unusual bleeding. Be careful brushing or flossing your teeth or using a toothpick because you may get an infection or bleed more easily. If you have any dental work done, tell your dentist you are receiving this medication. Talk to your care team if you may be pregnant. Serious birth defects can occur if you take this medication during pregnancy. Talk to your care team before breastfeeding. Changes to your treatment plan may be needed. What side effects may I notice from receiving this medication? Side effects that you  should report to your care team as soon as possible: Allergic reactions--skin rash, itching, hives, swelling of the face, lips, tongue, or throat Heart rhythm changes--fast or irregular heartbeat, dizziness, feeling faint or lightheaded, chest pain, trouble breathing Increase in blood pressure Infection--fever, chills, cough, sore throat, wounds that don't heal,  pain or trouble when passing urine, general feeling of discomfort or being unwell Low blood pressure--dizziness, feeling faint or lightheaded, blurry vision Low red blood cell level--unusual weakness or fatigue, dizziness, headache, trouble breathing Painful swelling, warmth, or redness of the skin, blisters or sores at the infusion site Pain, tingling, or numbness in the hands or feet Slow heartbeat--dizziness, feeling faint or lightheaded, confusion, trouble breathing, unusual weakness or fatigue Unusual bruising or bleeding Side effects that usually do not require medical attention (report to your care team if they continue or are bothersome): Diarrhea Hair loss Joint pain Loss of appetite Muscle pain Nausea Vomiting This list may not describe all possible side effects. Call your doctor for medical advice about side effects. You may report side effects to FDA at 1-800-FDA-1088. Where should I keep my medication? This medication is given in a hospital or clinic. It will not be stored at home. NOTE: This sheet is a summary. It may not cover all possible information. If you have questions about this medicine, talk to your doctor, pharmacist, or health care provider.  2024 [employer] Standard (2022-04-15 00:00:00)  Carboplatin Injection What is this medication? CARBOPLATIN (KAR boe pla tin) treats some types of cancer. It works by slowing down the growth of cancer cells. This medicine may be used for other purposes; ask your health care provider or pharmacist if you have questions. COMMON BRAND NAME(S): Paraplatin What should I tell my care team before I take this medication? They need to know if you have any of these conditions: Blood disorders Hearing problems Kidney disease Recent or ongoing radiation therapy An unusual or allergic reaction to carboplatin, cisplatin, other medications, foods, dyes, or preservatives Pregnant or trying to get pregnant Breast-feeding How should  I use this medication? This medication is injected into a vein. It is given by your care team in a hospital or clinic setting. Talk to your care team about the use of this medication in children. Special care may be needed. Overdosage: If you think you have taken too much of this medicine contact a poison control center or emergency room at once. NOTE: This medicine is only for you. Do not share this medicine with others. What if I miss a dose? Keep appointments for follow-up doses. It is important not to miss your dose. Call your care team if you are unable to keep an appointment. What may interact with this medication? Medications for seizures Some antibiotics, such as amikacin, gentamicin, neomycin, streptomycin, tobramycin Vaccines This list may not describe all possible interactions. Give your health care provider a list of all the medicines, herbs, non-prescription drugs, or dietary supplements you use. Also tell them if you smoke, drink alcohol, or use illegal drugs. Some items may interact with your medicine. What should I watch for while using this medication? Your condition will be monitored carefully while you are receiving this medication. You may need blood work while taking this medication. This medication may make you feel generally unwell. This is not uncommon, as chemotherapy can affect healthy cells as well as cancer cells. Report any side effects. Continue your course of treatment even though you feel ill unless your care team tells you to stop.  In some cases, you may be given additional medications to help with side effects. Follow all directions for their use. This medication may increase your risk of getting an infection. Call your care team for advice if you get a fever, chills, sore throat, or other symptoms of a cold or flu. Do not treat yourself. Try to avoid being around people who are sick. Avoid taking medications that contain aspirin, acetaminophen, ibuprofen, naproxen,  or ketoprofen unless instructed by your care team. These medications may hide a fever. Be careful brushing or flossing your teeth or using a toothpick because you may get an infection or bleed more easily. If you have any dental work done, tell your dentist you are receiving this medication. Talk to your care team if you wish to become pregnant or think you might be pregnant. This medication can cause serious birth defects. Talk to your care team about effective forms of contraception. Do not breast-feed while taking this medication. What side effects may I notice from receiving this medication? Side effects that you should report to your care team as soon as possible: Allergic reactions--skin rash, itching, hives, swelling of the face, lips, tongue, or throat Infection--fever, chills, cough, sore throat, wounds that don't heal, pain or trouble when passing urine, general feeling of discomfort or being unwell Low red blood cell level--unusual weakness or fatigue, dizziness, headache, trouble breathing Pain, tingling, or numbness in the hands or feet, muscle weakness, change in vision, confusion or trouble speaking, loss of balance or coordination, trouble walking, seizures Unusual bruising or bleeding Side effects that usually do not require medical attention (report to your care team if they continue or are bothersome): Hair loss Nausea Unusual weakness or fatigue Vomiting This list may not describe all possible side effects. Call your doctor for medical advice about side effects. You may report side effects to FDA at 1-800-FDA-1088. Where should I keep my medication? This medication is given in a hospital or clinic. It will not be stored at home. NOTE: This sheet is a summary. It may not cover all possible information. If you have questions about this medicine, talk to your doctor, pharmacist, or health care provider.  2024 [employer] Standard (2022-03-18 00:00:00)

## 2025-01-02 ENCOUNTER — Ambulatory Visit

## 2025-01-03 ENCOUNTER — Ambulatory Visit
Admission: RE | Admit: 2025-01-03 | Discharge: 2025-01-03 | Disposition: A | Source: Ambulatory Visit | Attending: Radiation Oncology

## 2025-01-03 ENCOUNTER — Ambulatory Visit
Admission: RE | Admit: 2025-01-03 | Discharge: 2025-01-03 | Disposition: A | Source: Ambulatory Visit | Attending: Radiation Oncology | Admitting: Radiation Oncology

## 2025-01-03 ENCOUNTER — Other Ambulatory Visit: Payer: Self-pay

## 2025-01-03 DIAGNOSIS — C3411 Malignant neoplasm of upper lobe, right bronchus or lung: Secondary | ICD-10-CM | POA: Diagnosis not present

## 2025-01-03 LAB — RAD ONC ARIA SESSION SUMMARY
Course Elapsed Days: 19
Plan Fractions Treated to Date: 13
Plan Prescribed Dose Per Fraction: 2 Gy
Plan Total Fractions Prescribed: 33
Plan Total Prescribed Dose: 66 Gy
Reference Point Dosage Given to Date: 26 Gy
Reference Point Session Dosage Given: 2 Gy
Session Number: 13

## 2025-01-04 ENCOUNTER — Ambulatory Visit
Admission: RE | Admit: 2025-01-04 | Discharge: 2025-01-04 | Disposition: A | Source: Ambulatory Visit | Attending: Radiation Oncology

## 2025-01-04 ENCOUNTER — Other Ambulatory Visit: Payer: Self-pay

## 2025-01-04 DIAGNOSIS — C3411 Malignant neoplasm of upper lobe, right bronchus or lung: Secondary | ICD-10-CM | POA: Diagnosis not present

## 2025-01-04 LAB — RAD ONC ARIA SESSION SUMMARY
Course Elapsed Days: 20
Plan Fractions Treated to Date: 14
Plan Prescribed Dose Per Fraction: 2 Gy
Plan Total Fractions Prescribed: 33
Plan Total Prescribed Dose: 66 Gy
Reference Point Dosage Given to Date: 28 Gy
Reference Point Session Dosage Given: 2 Gy
Session Number: 14

## 2025-01-05 ENCOUNTER — Ambulatory Visit
Admission: RE | Admit: 2025-01-05 | Discharge: 2025-01-05 | Disposition: A | Discharge status: Home / Self Care | Source: Ambulatory Visit | Attending: Radiation Oncology | Admitting: Radiation Oncology

## 2025-01-05 ENCOUNTER — Other Ambulatory Visit: Payer: Self-pay

## 2025-01-05 DIAGNOSIS — C3411 Malignant neoplasm of upper lobe, right bronchus or lung: Secondary | ICD-10-CM | POA: Diagnosis not present

## 2025-01-05 LAB — RAD ONC ARIA SESSION SUMMARY
Course Elapsed Days: 21
Plan Fractions Treated to Date: 15
Plan Prescribed Dose Per Fraction: 2 Gy
Plan Total Fractions Prescribed: 33
Plan Total Prescribed Dose: 66 Gy
Reference Point Dosage Given to Date: 30 Gy
Reference Point Session Dosage Given: 2 Gy
Session Number: 15

## 2025-01-05 NOTE — Progress Notes (Signed)
 " [facility] at [facility] [address] [place],  [place]  [address] [phone]  Clinic Day:  01/06/2025  Referring physician: Benjamine [name] DASEN, NP   HISTORY OF PRESENT ILLNESS:  The patient is a 69 y.o. 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  He also appears to have a very early stage I adenocarcinoma in his left lower lobe.  He comes in today before heading into his 4th week of chemoradiation.  Thus far he has tolerated his definitive therapy very well.  He continues to deny having any respiratory symptoms which concern him for disease progression.  PHYSICAL EXAM:  Blood pressure 122/73, pulse 69, temperature 97.7 F (36.5 C), temperature source Oral, resp. rate 16, height 5' 9.2 (1.758 m), weight 174 lb 9.6 oz (79.2 kg), SpO2 95%. Wt Readings from Last 3 Encounters:  01/06/25 174 lb 9.6 oz (79.2 kg)  12/23/24 176 lb 1.6 oz (79.9 kg)  12/13/24 175 lb 9.6 oz (79.7 kg)   Body mass index is 25.64 kg/m. Performance status (ECOG): 0 - Asymptomatic Physical Exam Constitutional:      Appearance: Normal appearance. He is not ill-appearing.  HENT:     Mouth/Throat:     Mouth: Mucous membranes are moist.     Pharynx: Oropharynx is clear. No oropharyngeal exudate or posterior oropharyngeal erythema.  Cardiovascular:     Rate and Rhythm: Normal rate and regular rhythm.     Heart sounds: No murmur heard.    No friction rub. No gallop.  Pulmonary:     Effort: Pulmonary effort is normal. No respiratory distress.     Breath sounds: Normal breath sounds. No wheezing, rhonchi or rales.  Abdominal:     General: Bowel sounds are normal. There is no distension.     Palpations: Abdomen is soft. There is no mass.     Tenderness: There is no abdominal tenderness.  Musculoskeletal:        General: No swelling.     Right lower leg: No edema.     Left lower leg: No edema.  Lymphadenopathy:     Cervical: No  cervical adenopathy.     Upper Body:     Right upper body: No supraclavicular or axillary adenopathy.     Left upper body: No supraclavicular or axillary adenopathy.     Lower Body: No right inguinal adenopathy. No left inguinal adenopathy.  Skin:    General: Skin is warm.     Coloration: Skin is not jaundiced.     Findings: No lesion or rash.  Neurological:     General: No focal deficit present.     Mental Status: He is alert and oriented to person, place, and time. Mental status is at baseline.  Psychiatric:        Mood and Affect: Mood normal.        Behavior: Behavior normal.        Thought Content: Thought content normal.    PATHOLOGY: His 2nd bronchoscopy/EBUS with biopsy on 11-08-24 revealed the following:  Lung, biopsy, RUL endobronchial lesion :      NON-SMALL CELL CARCINOMA, FAVOR ADENOCARCINOMA.      SEE NOTE.       Diagnosis Note : The specimen contains limited pulmonary parenchyma with limites      atypical large cells with rare mitosis. Immunohistochemical stains including      TTF-1, p40, synaptophysin, chromogranin A, Ki67 were attempted to characterize  the cells. However the stains are not-contributory due to the insufficiency of      the tumor cells. Please correlate the concurrent cytology specimen.      Controls worked appropriately.      The case was peer-reviewed by Dr. Reed who agrees with the diagnosis.   FINAL DIAGNOSIS  STATEMENT Of SPECIMEN ADEQUACY:   INTERPRETATION(S):      NON-SMALL CELL CARCINOMA, CONSISTENT WITH ADENOCARCINOMA.       NON-SMALL CELL CARCINOMA, CONSISTENT WITH ADENOCARCINOMA.       NON-SMALL CELL CARCINOMA, CONSISTENT WITH ADENOCARCINOMA.       LYMPHOCYTES PRESENT.   Diagnosis Note : Immunohistochemical stains for TTF-1 and p40 were performed on  part 1 and part3.The stain for TTF-1 highlights the malignant cells supporting  the diagnosis of adenocarcinoma.   ----------------------------------------------------------------------------------------------------------- FINAL MICROSCOPIC DIAGNOSIS:  A. LUNG, RUL, FINE NEEDLE ASPIRATION  BIOPSIES:  - Adenocarcinoma  - See comment.   COMMENT:  The specimen shows scant atypical epithelioid cells. Immunohistochemical  stains show the cells are positive for TTF-1 and are negative for p40.  The findings are consistent with adenocarcinoma.   B. LUNG, LLL TARGET #2, FINE NEEDLE ASPIRATION  BIOPSIES:  - Adenocarcinoma  - See comment   C. LUNG, LLL TARGET #2, BRUSHING:  - Rare atypical cells present  - Predominantly blood   LABS:      Latest Ref Rng & Units 01/06/2025    9:54 AM 12/30/2024   10:07 AM 12/23/2024    9:49 AM  CBC  WBC 4.0 - 10.5 K/uL 5.3  6.4  9.3   Hemoglobin 13.0 - 17.0 g/dL 87.2  87.1  85.8   Hematocrit 39.0 - 52.0 % 35.4  36.1  38.9   Platelets 150 - 400 K/uL 208  231  229       Latest Ref Rng & Units 01/06/2025    9:54 AM 12/30/2024   10:07 AM 12/23/2024    9:49 AM  CMP  Glucose 70 - 99 mg/dL 875  877  92   BUN 8 - 23 mg/dL 13  12  11    Creatinine 0.61 - 1.24 mg/dL 9.42  9.44  9.42   Sodium 135 - 145 mmol/L 139  139  138   Potassium 3.5 - 5.1 mmol/L 4.1  4.1  4.0   Chloride 98 - 111 mmol/L 105  105  104   CO2 22 - 32 mmol/L 28  27  27    Calcium 8.9 - 10.3 mg/dL 9.1  9.1  9.0   Total Protein 6.5 - 8.1 g/dL 5.8  6.0  6.3   Total Bilirubin 0.0 - 1.2 mg/dL 0.4  0.3  0.7   Alkaline Phos 38 - 126 U/L 76  75  80   AST 15 - 41 U/L 28  19  27    ALT 0 - 44 U/L 45  33  39      ASSESSMENT & PLAN:  A 69 y.o. male with bilateral lung adenocarcinomas.  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  The patient will proceed with his 4th week of concurrent chemoradiation, which consists of weekly carboplatin /paclitaxel .  Clinically, the patient appears to be doing very well.  I will see him back in  2 weeks before he has into his 6th and final week of chemoradiation.  The patient understands all the plans discussed today and is  in agreement with them.   Daniel Long DELENA Kerns, MD       "

## 2025-01-06 ENCOUNTER — Inpatient Hospital Stay: Admitting: Oncology

## 2025-01-06 ENCOUNTER — Telehealth: Payer: Self-pay | Admitting: Oncology

## 2025-01-06 ENCOUNTER — Encounter: Payer: Self-pay | Admitting: Oncology

## 2025-01-06 ENCOUNTER — Other Ambulatory Visit: Payer: Self-pay

## 2025-01-06 ENCOUNTER — Inpatient Hospital Stay

## 2025-01-06 ENCOUNTER — Ambulatory Visit
Admission: RE | Admit: 2025-01-06 | Discharge: 2025-01-06 | Disposition: A | Source: Ambulatory Visit | Attending: Radiation Oncology

## 2025-01-06 VITALS — BP 122/73 | HR 69 | Temp 97.7°F | Resp 16 | Ht 69.2 in | Wt 174.6 lb

## 2025-01-06 VITALS — BP 126/76 | HR 82 | Resp 16

## 2025-01-06 DIAGNOSIS — C3411 Malignant neoplasm of upper lobe, right bronchus or lung: Secondary | ICD-10-CM | POA: Diagnosis not present

## 2025-01-06 DIAGNOSIS — C3481 Malignant neoplasm of overlapping sites of right bronchus and lung: Secondary | ICD-10-CM | POA: Diagnosis not present

## 2025-01-06 DIAGNOSIS — Z5111 Encounter for antineoplastic chemotherapy: Secondary | ICD-10-CM | POA: Diagnosis not present

## 2025-01-06 LAB — RAD ONC ARIA SESSION SUMMARY
Course Elapsed Days: 22
Plan Fractions Treated to Date: 16
Plan Prescribed Dose Per Fraction: 2 Gy
Plan Total Fractions Prescribed: 33
Plan Total Prescribed Dose: 66 Gy
Reference Point Dosage Given to Date: 32 Gy
Reference Point Session Dosage Given: 2 Gy
Session Number: 16

## 2025-01-06 LAB — CMP (CANCER CENTER ONLY)
ALT: 45 U/L — ABNORMAL HIGH (ref 0–44)
AST: 28 U/L (ref 15–41)
Albumin: 3.4 g/dL — ABNORMAL LOW (ref 3.5–5.0)
Alkaline Phosphatase: 76 U/L (ref 38–126)
Anion gap: 6 (ref 5–15)
BUN: 13 mg/dL (ref 8–23)
CO2: 28 mmol/L (ref 22–32)
Calcium: 9.1 mg/dL (ref 8.9–10.3)
Chloride: 105 mmol/L (ref 98–111)
Creatinine: 0.57 mg/dL — ABNORMAL LOW (ref 0.61–1.24)
GFR, Estimated: >60 mL/min
Glucose, Bld: 124 mg/dL — ABNORMAL HIGH (ref 70–99)
Potassium: 4.1 mmol/L (ref 3.5–5.1)
Sodium: 139 mmol/L (ref 135–145)
Total Bilirubin: 0.4 mg/dL (ref 0.0–1.2)
Total Protein: 5.8 g/dL — ABNORMAL LOW (ref 6.5–8.1)

## 2025-01-06 LAB — CBC WITH DIFFERENTIAL (CANCER CENTER ONLY)
Abs Immature Granulocytes: 0.11 10*3/uL — ABNORMAL HIGH (ref 0.00–0.07)
Basophils Absolute: 0 10*3/uL (ref 0.0–0.1)
Basophils Relative: 0 %
Eosinophils Absolute: 0 10*3/uL (ref 0.0–0.5)
Eosinophils Relative: 1 %
HCT: 35.4 % — ABNORMAL LOW (ref 39.0–52.0)
Hemoglobin: 12.7 g/dL — ABNORMAL LOW (ref 13.0–17.0)
Immature Granulocytes: 2 %
Lymphocytes Relative: 14 %
Lymphs Abs: 0.8 10*3/uL (ref 0.7–4.0)
MCH: 32.3 pg (ref 26.0–34.0)
MCHC: 35.9 g/dL (ref 30.0–36.0)
MCV: 90.1 fL (ref 80.0–100.0)
Monocytes Absolute: 0.4 10*3/uL (ref 0.1–1.0)
Monocytes Relative: 8 %
Neutro Abs: 4 10*3/uL (ref 1.7–7.7)
Neutrophils Relative %: 75 %
Platelet Count: 208 10*3/uL (ref 150–400)
RBC: 3.93 MIL/uL — ABNORMAL LOW (ref 4.22–5.81)
RDW: 12.1 % (ref 11.5–15.5)
WBC Count: 5.3 10*3/uL (ref 4.0–10.5)
nRBC: 0 % (ref 0.0–0.2)

## 2025-01-06 MED ORDER — SODIUM CHLORIDE 0.9 % IV SOLN
45.0000 mg/m2 | Freq: Once | INTRAVENOUS | Status: AC
  Administered 2025-01-06: 90 mg via INTRAVENOUS
  Filled 2025-01-06: qty 15

## 2025-01-06 MED ORDER — SODIUM CHLORIDE 0.9 % IV SOLN
210.4000 mg | Freq: Once | INTRAVENOUS | Status: AC
  Administered 2025-01-06: 210 mg via INTRAVENOUS
  Filled 2025-01-06: qty 21

## 2025-01-06 MED ORDER — DEXAMETHASONE SOD PHOSPHATE PF 10 MG/ML IJ SOLN
10.0000 mg | Freq: Once | INTRAMUSCULAR | Status: AC
  Administered 2025-01-06: 10 mg via INTRAVENOUS
  Filled 2025-01-06: qty 1

## 2025-01-06 MED ORDER — SODIUM CHLORIDE 0.9 % IV SOLN: INTRAVENOUS | Status: DC

## 2025-01-06 MED ORDER — FAMOTIDINE IN NACL 20-0.9 MG/50ML-% IV SOLN
20.0000 mg | Freq: Once | INTRAVENOUS | Status: AC
  Administered 2025-01-06: 20 mg via INTRAVENOUS
  Filled 2025-01-06: qty 50

## 2025-01-06 MED ORDER — DIPHENHYDRAMINE HCL 50 MG/ML IJ SOLN
25.0000 mg | Freq: Once | INTRAMUSCULAR | Status: AC
  Administered 2025-01-06: 25 mg via INTRAVENOUS
  Filled 2025-01-06: qty 1

## 2025-01-06 MED ORDER — PALONOSETRON HCL INJECTION 0.25 MG/5ML
0.2500 mg | Freq: Once | INTRAVENOUS | Status: AC
  Administered 2025-01-06: 0.25 mg via INTRAVENOUS
  Filled 2025-01-06: qty 5

## 2025-01-06 NOTE — Patient Instructions (Signed)
 [facility] - A DEPT OF [facility]  Discharge Instructions: Thank you for choosing [facility] to provide your oncology and hematology care.  If you have a lab appointment with the Cancer Center, please go directly to the Cancer Center and check in at the registration area.   Wear comfortable clothing and clothing appropriate for easy access to any Portacath or PICC line.   We strive to give you quality time with your provider. You may need to reschedule your appointment if you arrive late (15 or more minutes).  Arriving late affects you and other patients whose appointments are after yours.  Also, if you miss three or more appointments without notifying the office, you may be dismissed from the clinic at the provider's discretion.      For prescription refill requests, have your pharmacy contact our office and allow 72 hours for refills to be completed.    Today you received the following chemotherapy and/or immunotherapy agents Paclitaxel & Carboplatin      To help prevent nausea and vomiting after your treatment, we encourage you to take your nausea medication as directed.  BELOW ARE SYMPTOMS THAT SHOULD BE REPORTED IMMEDIATELY: *FEVER GREATER THAN 100.4 F (38 C) OR HIGHER *CHILLS OR SWEATING *NAUSEA AND VOMITING THAT IS NOT CONTROLLED WITH YOUR NAUSEA MEDICATION *UNUSUAL SHORTNESS OF BREATH *UNUSUAL BRUISING OR BLEEDING *URINARY PROBLEMS (pain or burning when urinating, or frequent urination) *BOWEL PROBLEMS (unusual diarrhea, constipation, pain near the anus) TENDERNESS IN MOUTH AND THROAT WITH OR WITHOUT PRESENCE OF ULCERS (sore throat, sores in mouth, or a toothache) UNUSUAL RASH, SWELLING OR PAIN  UNUSUAL VAGINAL DISCHARGE OR ITCHING   Items with * indicate a potential emergency and should be followed up as soon as possible or go to the Emergency Department if any problems should occur.  Please show the CHEMOTHERAPY ALERT CARD or  IMMUNOTHERAPY ALERT CARD at check-in to the Emergency Department and triage nurse.  Should you have questions after your visit or need to cancel or reschedule your appointment, please contact [facility] [place] - A DEPT OF [facility]  Dept: [phone]  and follow the prompts.  Office hours are 8:00 a.m. to 4:30 p.m. Monday - Friday. Please note that voicemails left after 4:00 p.m. may not be returned until the following business day.  We are closed weekends and major holidays. You have access to a nurse at all times for urgent questions. Please call the main number to the clinic Dept: [phone] and follow the prompts.  For any non-urgent questions, you may also contact your provider using MyChart. We now offer e-Visits for anyone 48 and older to request care online for non-urgent symptoms. For details visit [email or web address].   Also download the MyChart app! Go to the app store, search "MyChart", open the app, select , and log in with your MyChart username and password.

## 2025-01-06 NOTE — Telephone Encounter (Signed)
 Patient has been scheduled for follow-up visit per 01/06/2025 LOS.  Pt given an appt calendar with date and time.

## 2025-01-09 ENCOUNTER — Ambulatory Visit

## 2025-01-09 ENCOUNTER — Encounter: Payer: Self-pay | Admitting: Oncology

## 2025-01-10 ENCOUNTER — Ambulatory Visit
Admission: RE | Admit: 2025-01-10 | Discharge: 2025-01-10 | Disposition: A | Source: Ambulatory Visit | Attending: Radiation Oncology | Admitting: Radiation Oncology

## 2025-01-10 ENCOUNTER — Ambulatory Visit
Admission: RE | Admit: 2025-01-10 | Discharge: 2025-01-10 | Disposition: A | Source: Ambulatory Visit | Attending: Radiation Oncology

## 2025-01-10 ENCOUNTER — Other Ambulatory Visit: Payer: Self-pay

## 2025-01-10 LAB — RAD ONC ARIA SESSION SUMMARY
Course Elapsed Days: 26
Plan Fractions Treated to Date: 17
Plan Prescribed Dose Per Fraction: 2 Gy
Plan Total Fractions Prescribed: 33
Plan Total Prescribed Dose: 66 Gy
Reference Point Dosage Given to Date: 34 Gy
Reference Point Session Dosage Given: 2 Gy
Session Number: 17

## 2025-01-11 ENCOUNTER — Ambulatory Visit: Admission: RE | Admit: 2025-01-11 | Discharge: 2025-01-11 | Attending: Radiation Oncology

## 2025-01-11 ENCOUNTER — Other Ambulatory Visit: Payer: Self-pay

## 2025-01-11 ENCOUNTER — Ambulatory Visit

## 2025-01-11 LAB — RAD ONC ARIA SESSION SUMMARY
Course Elapsed Days: 27
Plan Fractions Treated to Date: 18
Plan Prescribed Dose Per Fraction: 2 Gy
Plan Total Fractions Prescribed: 33
Plan Total Prescribed Dose: 66 Gy
Reference Point Dosage Given to Date: 36 Gy
Reference Point Session Dosage Given: 2 Gy
Session Number: 18

## 2025-01-12 ENCOUNTER — Other Ambulatory Visit: Payer: Self-pay

## 2025-01-12 ENCOUNTER — Ambulatory Visit: Admission: RE | Admit: 2025-01-12 | Discharge: 2025-01-12 | Attending: Radiation Oncology

## 2025-01-12 ENCOUNTER — Ambulatory Visit

## 2025-01-12 LAB — RAD ONC ARIA SESSION SUMMARY
Course Elapsed Days: 28
Plan Fractions Treated to Date: 19
Plan Prescribed Dose Per Fraction: 2 Gy
Plan Total Fractions Prescribed: 33
Plan Total Prescribed Dose: 66 Gy
Reference Point Dosage Given to Date: 38 Gy
Reference Point Session Dosage Given: 2 Gy
Session Number: 19

## 2025-01-13 ENCOUNTER — Inpatient Hospital Stay

## 2025-01-13 ENCOUNTER — Ambulatory Visit: Admission: RE | Admit: 2025-01-13

## 2025-01-13 ENCOUNTER — Ambulatory Visit

## 2025-01-13 ENCOUNTER — Other Ambulatory Visit: Payer: Self-pay

## 2025-01-13 VITALS — BP 104/63 | HR 81 | Temp 98.2°F | Resp 18

## 2025-01-13 DIAGNOSIS — C3481 Malignant neoplasm of overlapping sites of right bronchus and lung: Secondary | ICD-10-CM

## 2025-01-13 LAB — CBC WITH DIFFERENTIAL (CANCER CENTER ONLY)
Abs Immature Granulocytes: 0.11 10*3/uL — ABNORMAL HIGH (ref 0.00–0.07)
Basophils Absolute: 0 10*3/uL (ref 0.0–0.1)
Basophils Relative: 1 %
Eosinophils Absolute: 0 10*3/uL (ref 0.0–0.5)
Eosinophils Relative: 1 %
HCT: 32.9 % — ABNORMAL LOW (ref 39.0–52.0)
Hemoglobin: 11.6 g/dL — ABNORMAL LOW (ref 13.0–17.0)
Immature Granulocytes: 3 %
Lymphocytes Relative: 22 %
Lymphs Abs: 0.9 10*3/uL (ref 0.7–4.0)
MCH: 32.1 pg (ref 26.0–34.0)
MCHC: 35.3 g/dL (ref 30.0–36.0)
MCV: 91.1 fL (ref 80.0–100.0)
Monocytes Absolute: 0.3 10*3/uL (ref 0.1–1.0)
Monocytes Relative: 8 %
Neutro Abs: 2.8 10*3/uL (ref 1.7–7.7)
Neutrophils Relative %: 65 %
Platelet Count: 175 10*3/uL (ref 150–400)
RBC: 3.61 MIL/uL — ABNORMAL LOW (ref 4.22–5.81)
RDW: 12.6 % (ref 11.5–15.5)
WBC Count: 4.1 10*3/uL (ref 4.0–10.5)
nRBC: 0 % (ref 0.0–0.2)

## 2025-01-13 LAB — RAD ONC ARIA SESSION SUMMARY
Course Elapsed Days: 29
Plan Fractions Treated to Date: 20
Plan Prescribed Dose Per Fraction: 2 Gy
Plan Total Fractions Prescribed: 33
Plan Total Prescribed Dose: 66 Gy
Reference Point Dosage Given to Date: 40 Gy
Reference Point Session Dosage Given: 2 Gy
Session Number: 20

## 2025-01-13 LAB — CMP (CANCER CENTER ONLY)
ALT: 39 U/L (ref 0–44)
AST: 22 U/L (ref 15–41)
Albumin: 3.2 g/dL — ABNORMAL LOW (ref 3.5–5.0)
Alkaline Phosphatase: 72 U/L (ref 38–126)
Anion gap: 7 (ref 5–15)
BUN: 14 mg/dL (ref 8–23)
CO2: 27 mmol/L (ref 22–32)
Calcium: 8.6 mg/dL — ABNORMAL LOW (ref 8.9–10.3)
Chloride: 106 mmol/L (ref 98–111)
Creatinine: 0.54 mg/dL — ABNORMAL LOW (ref 0.61–1.24)
GFR, Estimated: >60 mL/min
Glucose, Bld: 131 mg/dL — ABNORMAL HIGH (ref 70–99)
Potassium: 4.1 mmol/L (ref 3.5–5.1)
Sodium: 140 mmol/L (ref 135–145)
Total Bilirubin: 0.4 mg/dL (ref 0.0–1.2)
Total Protein: 5.7 g/dL — ABNORMAL LOW (ref 6.5–8.1)

## 2025-01-13 MED ORDER — DEXAMETHASONE SOD PHOSPHATE PF 10 MG/ML IJ SOLN
10.0000 mg | Freq: Once | INTRAMUSCULAR | Status: AC
  Administered 2025-01-13: 10 mg via INTRAVENOUS
  Filled 2025-01-13: qty 1

## 2025-01-13 MED ORDER — FAMOTIDINE IN NACL 20-0.9 MG/50ML-% IV SOLN
20.0000 mg | Freq: Once | INTRAVENOUS | Status: AC
  Administered 2025-01-13: 20 mg via INTRAVENOUS
  Filled 2025-01-13: qty 50

## 2025-01-13 MED ORDER — PALONOSETRON HCL INJECTION 0.25 MG/5ML
0.2500 mg | Freq: Once | INTRAVENOUS | Status: AC
  Administered 2025-01-13: 0.25 mg via INTRAVENOUS
  Filled 2025-01-13: qty 5

## 2025-01-13 MED ORDER — SODIUM CHLORIDE 0.9 % IV SOLN
45.0000 mg/m2 | Freq: Once | INTRAVENOUS | Status: AC
  Administered 2025-01-13: 90 mg via INTRAVENOUS
  Filled 2025-01-13: qty 15

## 2025-01-13 MED ORDER — SODIUM CHLORIDE 0.9 % IV SOLN
210.4000 mg | Freq: Once | INTRAVENOUS | Status: AC
  Administered 2025-01-13: 210 mg via INTRAVENOUS
  Filled 2025-01-13: qty 21

## 2025-01-13 MED ORDER — DIPHENHYDRAMINE HCL 50 MG/ML IJ SOLN
25.0000 mg | Freq: Once | INTRAMUSCULAR | Status: AC
  Administered 2025-01-13: 25 mg via INTRAVENOUS
  Filled 2025-01-13: qty 1

## 2025-01-13 MED ORDER — SODIUM CHLORIDE 0.9 % IV SOLN: INTRAVENOUS | Status: DC

## 2025-01-13 NOTE — Patient Instructions (Signed)
 [facility] - A DEPT OF [facility]  Discharge Instructions: Thank you for choosing [facility] to provide your oncology and hematology care.  If you have a lab appointment with the Cancer Center, please go directly to the Cancer Center and check in at the registration area.   Wear comfortable clothing and clothing appropriate for easy access to any Portacath or PICC line.   We strive to give you quality time with your provider. You may need to reschedule your appointment if you arrive late (15 or more minutes).  Arriving late affects you and other patients whose appointments are after yours.  Also, if you miss three or more appointments without notifying the office, you may be dismissed from the clinic at the provider's discretion.      For prescription refill requests, have your pharmacy contact our office and allow 72 hours for refills to be completed.    Today you received the following chemotherapy and/or immunotherapy agents Paclitaxel & Carboplatin      To help prevent nausea and vomiting after your treatment, we encourage you to take your nausea medication as directed.  BELOW ARE SYMPTOMS THAT SHOULD BE REPORTED IMMEDIATELY: *FEVER GREATER THAN 100.4 F (38 C) OR HIGHER *CHILLS OR SWEATING *NAUSEA AND VOMITING THAT IS NOT CONTROLLED WITH YOUR NAUSEA MEDICATION *UNUSUAL SHORTNESS OF BREATH *UNUSUAL BRUISING OR BLEEDING *URINARY PROBLEMS (pain or burning when urinating, or frequent urination) *BOWEL PROBLEMS (unusual diarrhea, constipation, pain near the anus) TENDERNESS IN MOUTH AND THROAT WITH OR WITHOUT PRESENCE OF ULCERS (sore throat, sores in mouth, or a toothache) UNUSUAL RASH, SWELLING OR PAIN  UNUSUAL VAGINAL DISCHARGE OR ITCHING   Items with * indicate a potential emergency and should be followed up as soon as possible or go to the Emergency Department if any problems should occur.  Please show the CHEMOTHERAPY ALERT CARD or  IMMUNOTHERAPY ALERT CARD at check-in to the Emergency Department and triage nurse.  Should you have questions after your visit or need to cancel or reschedule your appointment, please contact [facility] [place] - A DEPT OF [facility]  Dept: [phone]  and follow the prompts.  Office hours are 8:00 a.m. to 4:30 p.m. Monday - Friday. Please note that voicemails left after 4:00 p.m. may not be returned until the following business day.  We are closed weekends and major holidays. You have access to a nurse at all times for urgent questions. Please call the main number to the clinic Dept: [phone] and follow the prompts.  For any non-urgent questions, you may also contact your provider using MyChart. We now offer e-Visits for anyone 48 and older to request care online for non-urgent symptoms. For details visit [email or web address].   Also download the MyChart app! Go to the app store, search "MyChart", open the app, select , and log in with your MyChart username and password.

## 2025-01-16 ENCOUNTER — Ambulatory Visit

## 2025-01-17 ENCOUNTER — Ambulatory Visit

## 2025-01-17 ENCOUNTER — Ambulatory Visit: Admitting: Radiation Oncology

## 2025-01-18 ENCOUNTER — Ambulatory Visit

## 2025-01-19 ENCOUNTER — Ambulatory Visit

## 2025-01-20 ENCOUNTER — Inpatient Hospital Stay

## 2025-01-20 ENCOUNTER — Ambulatory Visit

## 2025-01-20 ENCOUNTER — Inpatient Hospital Stay: Admitting: Oncology

## 2025-01-23 ENCOUNTER — Ambulatory Visit

## 2025-01-24 ENCOUNTER — Ambulatory Visit

## 2025-01-24 ENCOUNTER — Ambulatory Visit: Admitting: Radiation Oncology

## 2025-01-25 ENCOUNTER — Ambulatory Visit

## 2025-01-26 ENCOUNTER — Ambulatory Visit

## 2025-01-27 ENCOUNTER — Ambulatory Visit

## 2025-01-27 ENCOUNTER — Inpatient Hospital Stay

## 2025-01-30 ENCOUNTER — Ambulatory Visit

## 2025-01-31 ENCOUNTER — Ambulatory Visit

## 2025-01-31 ENCOUNTER — Ambulatory Visit: Admitting: Radiation Oncology

## 2025-02-01 ENCOUNTER — Ambulatory Visit

## 2025-02-10 ENCOUNTER — Inpatient Hospital Stay
